# Patient Record
Sex: Female | Born: 1955
Health system: Southern US, Community
[De-identification: ages and names within clinical notes are randomized; demographics above are authoritative.]

## PROBLEM LIST (undated history)

## (undated) DIAGNOSIS — E538 Deficiency of other specified B group vitamins: Secondary | ICD-10-CM

## (undated) DIAGNOSIS — Z8719 Personal history of other diseases of the digestive system: Secondary | ICD-10-CM

## (undated) DIAGNOSIS — F259 Schizoaffective disorder, unspecified: Secondary | ICD-10-CM

## (undated) DIAGNOSIS — F339 Major depressive disorder, recurrent, unspecified: Secondary | ICD-10-CM

## (undated) DIAGNOSIS — D509 Iron deficiency anemia, unspecified: Secondary | ICD-10-CM

## (undated) DIAGNOSIS — F319 Bipolar disorder, unspecified: Secondary | ICD-10-CM

## (undated) DIAGNOSIS — Z86718 Personal history of other venous thrombosis and embolism: Secondary | ICD-10-CM

## (undated) HISTORY — DX: Personal history of other venous thrombosis and embolism: Z86.718

## (undated) HISTORY — DX: Deficiency of other specified B group vitamins: E53.8

## (undated) HISTORY — DX: Major depressive disorder, recurrent, unspecified: F33.9

## (undated) HISTORY — DX: Iron deficiency anemia, unspecified: D50.9

## (undated) HISTORY — DX: Bipolar disorder, unspecified: F31.9

## (undated) HISTORY — DX: Schizoaffective disorder, unspecified: F25.9

## (undated) HISTORY — DX: Personal history of other diseases of the digestive system: Z87.19

---

## 1985-07-12 HISTORY — PX: AUGMENTATION MAMMAPLASTY: SUR837

## 2011-08-31 DIAGNOSIS — F259 Schizoaffective disorder, unspecified: Secondary | ICD-10-CM

## 2011-08-31 HISTORY — DX: Schizoaffective disorder, unspecified: F25.9

## 2011-09-06 DIAGNOSIS — F339 Major depressive disorder, recurrent, unspecified: Secondary | ICD-10-CM

## 2011-09-06 HISTORY — DX: Major depressive disorder, recurrent, unspecified: F33.9

## 2011-12-30 DIAGNOSIS — Z8719 Personal history of other diseases of the digestive system: Secondary | ICD-10-CM

## 2011-12-30 HISTORY — DX: Personal history of other diseases of the digestive system: Z87.19

## 2012-01-25 DIAGNOSIS — E538 Deficiency of other specified B group vitamins: Secondary | ICD-10-CM

## 2012-01-25 HISTORY — DX: Deficiency of other specified B group vitamins: E53.8

## 2013-09-12 DIAGNOSIS — K257 Chronic gastric ulcer without hemorrhage or perforation: Secondary | ICD-10-CM | POA: Insufficient documentation

## 2013-09-12 DIAGNOSIS — K449 Diaphragmatic hernia without obstruction or gangrene: Secondary | ICD-10-CM | POA: Insufficient documentation

## 2014-11-06 DIAGNOSIS — D509 Iron deficiency anemia, unspecified: Secondary | ICD-10-CM

## 2014-11-06 HISTORY — DX: Iron deficiency anemia, unspecified: D50.9

## 2015-01-08 LAB — PROTIME-INR: INR: 2 — AB (ref 0.9–1.1)

## 2015-08-20 LAB — HM MAMMOGRAPHY

## 2015-08-20 LAB — HM DEXA SCAN

## 2017-06-10 LAB — BASIC METABOLIC PANEL
ALBUMIN: 3.6
BUN: 15 (ref 4–21)
CALCIUM: 8.8
CO2: 23
Chloride: 105
Creatinine: 0.6 (ref 0.5–1.1)
EGFR (Non-African Amer.): 97
Glucose: 74
PROTEIN: 6
Potassium: 4.3 (ref 3.4–5.3)
Sodium: 142 (ref 137–147)

## 2017-06-10 LAB — CBC AND DIFFERENTIAL
HCT: 40 (ref 36–46)
Hemoglobin: 13.5 (ref 12.0–16.0)
PLATELETS: 222 (ref 150–399)
WBC: 4.4

## 2017-06-10 LAB — HEPATIC FUNCTION PANEL
ALT: 9 (ref 7–35)
AST: 12 — AB (ref 13–35)

## 2017-06-10 LAB — LIPID PANEL
Cholesterol: 138 (ref 0–200)
HDL: 34 — AB (ref 35–70)
LDL Cholesterol: 87
TRIGLYCERIDES: 84 (ref 40–160)

## 2017-07-27 ENCOUNTER — Ambulatory Visit: Payer: Self-pay | Admitting: Osteopathic Medicine

## 2017-08-02 ENCOUNTER — Ambulatory Visit (INDEPENDENT_AMBULATORY_CARE_PROVIDER_SITE_OTHER): Payer: Medicare Other | Admitting: Osteopathic Medicine

## 2017-08-02 ENCOUNTER — Telehealth: Payer: Self-pay | Admitting: Osteopathic Medicine

## 2017-08-02 ENCOUNTER — Encounter: Payer: Self-pay | Admitting: Osteopathic Medicine

## 2017-08-02 VITALS — BP 140/72 | HR 65 | Temp 98.7°F | Ht 66.0 in | Wt 210.0 lb

## 2017-08-02 DIAGNOSIS — E7800 Pure hypercholesterolemia, unspecified: Secondary | ICD-10-CM

## 2017-08-02 DIAGNOSIS — Z79899 Other long term (current) drug therapy: Secondary | ICD-10-CM

## 2017-08-02 DIAGNOSIS — K5900 Constipation, unspecified: Secondary | ICD-10-CM

## 2017-08-02 DIAGNOSIS — G8929 Other chronic pain: Secondary | ICD-10-CM

## 2017-08-02 DIAGNOSIS — G4700 Insomnia, unspecified: Secondary | ICD-10-CM

## 2017-08-02 DIAGNOSIS — E538 Deficiency of other specified B group vitamins: Secondary | ICD-10-CM | POA: Diagnosis not present

## 2017-08-02 DIAGNOSIS — Z5181 Encounter for therapeutic drug level monitoring: Secondary | ICD-10-CM | POA: Diagnosis not present

## 2017-08-02 DIAGNOSIS — F319 Bipolar disorder, unspecified: Secondary | ICD-10-CM

## 2017-08-02 DIAGNOSIS — G2581 Restless legs syndrome: Secondary | ICD-10-CM

## 2017-08-02 DIAGNOSIS — F3170 Bipolar disorder, currently in remission, most recent episode unspecified: Secondary | ICD-10-CM | POA: Diagnosis not present

## 2017-08-02 DIAGNOSIS — Z7901 Long term (current) use of anticoagulants: Secondary | ICD-10-CM

## 2017-08-02 DIAGNOSIS — F99 Mental disorder, not otherwise specified: Secondary | ICD-10-CM | POA: Diagnosis not present

## 2017-08-02 DIAGNOSIS — D509 Iron deficiency anemia, unspecified: Secondary | ICD-10-CM

## 2017-08-02 DIAGNOSIS — Z86718 Personal history of other venous thrombosis and embolism: Secondary | ICD-10-CM | POA: Diagnosis not present

## 2017-08-02 HISTORY — DX: Bipolar disorder, unspecified: F31.9

## 2017-08-02 HISTORY — DX: Personal history of other venous thrombosis and embolism: Z86.718

## 2017-08-02 MED ORDER — CLONAZEPAM 0.5 MG PO TABS: 0.5000 mg | ORAL_TABLET | Freq: Three times a day (TID) | ORAL | 0 refills | Status: DC | PRN

## 2017-08-02 MED ORDER — FERROUS SULFATE 325 (65 FE) MG PO TABS: 325.0000 mg | ORAL_TABLET | Freq: Every day | ORAL | 1 refills | Status: DC

## 2017-08-02 MED ORDER — HYDROXYZINE HCL 50 MG PO TABS
100.0000 mg | ORAL_TABLET | Freq: Every evening | ORAL | 1 refills | Status: DC | PRN
Start: 2017-08-02 — End: 2017-10-03

## 2017-08-02 MED ORDER — METOPROLOL SUCCINATE ER 100 MG PO TB24: 100.0000 mg | ORAL_TABLET | Freq: Every day | ORAL | 1 refills | Status: DC

## 2017-08-02 MED ORDER — FENOFIBRATE 145 MG PO TABS: 145.0000 mg | ORAL_TABLET | Freq: Every day | ORAL | 1 refills | Status: DC

## 2017-08-02 MED ORDER — DOCUSATE SODIUM 100 MG PO CAPS: 100.0000 mg | ORAL_CAPSULE | Freq: Every day | ORAL | 1 refills | Status: DC

## 2017-08-02 MED ORDER — VITAMIN B-12 1000 MCG PO TABS: ORAL_TABLET | ORAL | 1 refills | Status: DC

## 2017-08-02 MED ORDER — ROPINIROLE HCL 1 MG PO TABS: 1.0000 mg | ORAL_TABLET | Freq: Two times a day (BID) | ORAL | 1 refills | Status: DC

## 2017-08-02 MED ORDER — WARFARIN SODIUM 5 MG PO TABS: ORAL_TABLET | ORAL | 1 refills | Status: DC

## 2017-08-02 NOTE — Patient Instructions (Signed)
   Will get records from Dr Teola BradleyBarr and Dr Caryn SectionFox  I am not comfortable continuing Clonazepam for patients who are on strong pain medications. I will discuss with Dr Caryn SectionFox and will refill for now to prevent withdrawals, but further refills will very likely be a his discretion. Will know more after I speak with him about your case.   Will send refills by end of day today, should be ready at your pharmacy tomorrow.   Plan to recheck with me in 1-2 months, I should have received all necessary records by that time. Come see me sooner if needed!

## 2017-08-02 NOTE — Telephone Encounter (Signed)
Please call patient or can speak to Honor JunesAngela White, caseworker.   I reviewed the records from Dr. Caryn SectionFox, psychiatrist. He had some concerns about continuing with the clonazepam/Klonopin given the other medications she is taking. I'll still refill for now until I have reviewed the records from Dr. Teola BradleyBarr but at next visit, we will have a discussion about tapering down and coming off of this medication (clonazepam) or she can discuss it with Dr. Caryn SectionFox. I had to fax this prescription into the pharmacy, was unable to send it electronically.   Other medications should be no issues, everything was sent to pharmacy on file[facility] [place].

## 2017-08-02 NOTE — Progress Notes (Signed)
HPI: Darden DatesMargaret B Howell is a 62 y.o. female who  has no past medical history on file.  she presents to [facility] today, 08/02/17,  for chief complaint of:   New to establish care. See specific headings.   New patient presents to clinic with her case worker, Honor Junesngela White. I have patient's permission to speak in front of this person freely.  Previously was being followed by a physician service which came to her home, she states that they wanted to see her every month but this was going to be too expensive for her. Her caseworker has a bit of a different story, so like there may have been some missed appointments. We'll need to await records there.  Also awaiting records from previous PCP with regards to more extensive medical history. It appears she is being followed by psychiatry, I receive these notes before the end of the day, see below. Looks like she is also under treatment from previous PCP with a few other psychoactive/CNS medications, including clonazepam 0.5 mg 2 tablets a.m. and 1 tablet at bedtime, ropinirole 1 mg twice daily, hydroxyzine at bedtime as needed for sleep. Other medications include vitamin B12, ferrous sulfate, stool softener, metoprolol, senna separate, warfarin.   Patient is checking INR at home on a weekly basis. She is currently taking 5 mg warfarin, 1.5 tablets on Tuesdays and Fridays, 1 tablet all other days. It confusing story here patient states that she had a blood clot due to anemia requiring a blood transfusion at one point, she has been very nervous to come off of anticoagulation given her family history, but no personal history, of stroke and heart issues. Patient also has a documented history of a GI bleed in her problem list.   History of schizoaffective disorder, bipolar type. History of alcohol dependence and cocaine dependence, both in remission. Dr Caryn SectionFox - psychiatry. He is not the prescriber for the benzodiazepine. Per his  most recent no 07/20/2017: "Remain concerned about her opiate and benzo, prescribed elsewhere but patient dismisses dangers." He is continuing the mirtazapine, risperidone, melatonin without change and planning to follow-up every 4 months or sooner as needed.   Chronic pain:  PMP reviewed. She is also on Fentanyl patches is not on her medication list. Caseworker does have records from pain management, patient was recently seen there and there were no concerns.   Patient is accompanied by social worker/guardian who assists with history-taking.   Past medical, surgical, social and family history reviewed:  Patient Active Problem List   Diagnosis Date Noted  . Bipolar disorder (HCC) 08/02/2017  . High risk medication use 08/02/2017  . History of DVT (deep vein thrombosis) 08/02/2017  . Other chronic pain 08/02/2017  . Psychiatric illness 08/02/2017  . Iron deficiency anemia 11/06/2014  . Chronic gastric ulcer without hemorrhage or perforation 09/12/2013  . Paraesophageal hernia 09/12/2013  . B12 deficiency 01/25/2012  . History of GI bleed 12/30/2011  . Major depressive disorder, recurrent episode (HCC) 09/06/2011  . Schizoaffective disorder (HCC) 08/31/2011     Social History   Tobacco Use  . Smoking status: Not on file  Substance Use Topics  . Alcohol use: Not on file    No family history on file.   Current medication list and allergy/intolerance information reviewed:    Current Outpatient Medications  Medication Sig Dispense Refill  . clonazePAM (KLONOPIN) 0.5 MG tablet Take 0.5 mg by mouth 3 (three) times daily as needed (Taking 2 in the  morning and 1 at bedtime).     Marland Kitchen docusate sodium (COLACE) 100 MG capsule Take 100-200 mg by mouth daily.     . fenofibrate (TRICOR) 145 MG tablet     . fentaNYL (DURAGESIC - DOSED MCG/HR) 75 MCG/HR Place onto the skin.    . ferrous sulfate 325 (65 FE) MG tablet Take by mouth.    . hydrOXYzine (ATARAX/VISTARIL) 50 MG tablet Take 100 mg  by mouth at bedtime as needed (help with sleep).    . Melatonin 3 MG TABS Take 3 mg by mouth at bedtime.    . metoprolol succinate (TOPROL-XL) 100 MG 24 hr tablet     . mirtazapine (REMERON) 45 MG tablet Take 45 mg by mouth at bedtime.     . risperiDONE (RISPERDAL) 3 MG tablet Take 3 mg by mouth at bedtime.     Marland Kitchen rOPINIRole (REQUIP) 1 MG tablet Take 1 mg by mouth 2 (two) times daily.    . vitamin B-12 (CYANOCOBALAMIN) 1000 MCG tablet TAKE 1 TABLET BY MOUTH DAILY FOR 30 DAYS.    Marland Kitchen warfarin (COUMADIN) 5 MG tablet Take 5 mg by mouth. 1.5 tablets Tuesdays, Fridays. 1 tablet all other days    . albuterol (PROVENTIL HFA;VENTOLIN HFA) 108 (90 Base) MCG/ACT inhaler Inhale into the lungs.    Marland Kitchen amitriptyline (ELAVIL) 100 MG tablet Take by mouth.    . levothyroxine (SYNTHROID, LEVOTHROID) 25 MCG tablet Take by mouth.    . nystatin cream (MYCOSTATIN) Apply to affected areas under breasts in in groin line 3-4 times per day until rash cleared.    . sertraline (ZOLOFT) 100 MG tablet Take by mouth.    . triamcinolone cream (KENALOG) 0.1 % APPLY TO THE AFFECTED AREA(S) UNDER breast AND BETWEEN LEGS 3 TO 4 times a day until clear     No current facility-administered medications for this visit.     Allergies not on file    Review of Systems:  Constitutional:  No  fever, no chills, No recent illness, No unintentional weight changes. +significant fatigue.   HEENT: No  headache, no vision change, no hearing change, No sore throat, No  sinus pressure  Cardiac: No  chest pain, No  pressure, No palpitations, No  Orthopnea  Respiratory:  No  shortness of breath. No  Cough  Gastrointestinal: No  abdominal pain, No  nausea, No  vomiting,  No  blood in stool, No  diarrhea, No  constipation   Musculoskeletal: No new myalgia/arthralgia  Genitourinary: No  incontinence, No  abnormal genital bleeding, No abnormal genital discharge  Skin: No  Rash, No other wounds/concerning lesions  Hem/Onc: No  easy  bruising/bleeding, No  abnormal lymph node  Endocrine: No cold intolerance,  No heat intolerance. No polyuria/polydipsia/polyphagia   Neurologic: No  weakness, No  dizziness, No  slurred speech/focal weakness/facial droop  Psychiatric: +concerns with depression, +concerns with anxiety, +sleep problems, No mood problems  Exam:  BP 140/72   Pulse 65   Temp 98.7 F (37.1 C) (Oral)   Ht 5\' 6"  (1.676 m)   Wt 210 lb 0.6 oz (95.3 kg)   BMI 33.90 kg/m   Constitutional: VS see above. General Appearance: alert, well-developed, well-nourished, NAD  Eyes: Normal lids and conjunctive, non-icteric sclera  Ears, Nose, Mouth, Throat: MMM, Normal external inspection ears/nares/mouth/lips/gums. TM normal bilaterally. Pharynx/tonsils no erythema, no exudate. Nasal mucosa normal.   Neck: No masses, trachea midline. No thyroid enlargement. No tenderness/mass appreciated. No lymphadenopathy  Respiratory: Normal respiratory effort.  no wheeze, no rhonchi, no rales  Cardiovascular: S1/S2 normal, no murmur, no rub/gallop auscultated. RRR. No lower extremity edema.   Musculoskeletal: Gait normal.  Neurological: Normal balance/coordination. No tremor.  Skin: warm, dry, intact. No rash/ulcer.    Psychiatric: Poor judgment/insight. Flat mood and affect. Oriented x3.     ASSESSMENT/PLAN: See instructions below. Obviously, I have reservations about some of the medications she is taking. Will withhold judgment and refill for now until I have had a chance to review psychiatric records, records from previous PCP. We will certainly have a discussion about tapering down and coming off of the clonazepam versus having psychiatrist take this over. Based on his record review though, looks like he is hesitant to do this also given the obvious risks. Would also strongly consider discontinuing anticoagulation, patient seems to be using this as a security blanket due to her concern for stroke. I'm not sure that the  benefits outweigh the risks in this case.  High risk medication use - Plan: clonazePAM (KLONOPIN) 0.5 MG tablet  Anticoagulation goal of INR 2 to 3  History of DVT (deep vein thrombosis) - Plan: warfarin (COUMADIN) 5 MG tablet  Psychiatric illness - Plan: clonazePAM (KLONOPIN) 0.5 MG tablet  Other chronic pain  Bipolar affective disorder in remission (HCC)  Restless leg syndrome - Plan: rOPINIRole (REQUIP) 1 MG tablet  B12 deficiency - Plan: vitamin B-12 (CYANOCOBALAMIN) 1000 MCG tablet  Iron deficiency anemia, unspecified iron deficiency anemia type - Plan: ferrous sulfate 325 (65 FE) MG tablet  Constipation, unspecified constipation type - Plan: docusate sodium (COLACE) 100 MG capsule  High cholesterol - Plan: fenofibrate (TRICOR) 145 MG tablet  Insomnia, unspecified type - Plan: hydrOXYzine (ATARAX/VISTARIL) 50 MG tablet    Patient Instructions   Will get records from Dr Teola Bradley and Dr Caryn Section  I am not comfortable continuing Clonazepam for patients who are on strong pain medications. I will discuss with Dr Caryn Section and will refill for now to prevent withdrawals, but further refills will very likely be a his discretion. Will know more after I speak with him about your case.   Will send refills by end of day today, should be ready at your pharmacy tomorrow.   Plan to recheck with me in 1-2 months, I should have received all necessary records by that time. Come see me sooner if needed!     Visit summary with medication list and pertinent instructions was printed for patient to review. All questions at time of visit were answered - patient instructed to contact office with any additional concerns. ER/RTC precautions were reviewed with the patient.   Follow-up plan: Return for recheck chronic medical problems in 1-2 months, sooner if needed .  Note: Total time spent 60 minutes, greater than 50% of the visit was spent face-to-face counseling and coordinating care for the following: The  primary encounter diagnosis was High risk medication use. Diagnoses of Anticoagulation goal of INR 2 to 3, History of DVT (deep vein thrombosis), Psychiatric illness, Other chronic pain, Bipolar affective disorder in remission (HCC), Restless leg syndrome, B12 deficiency, Iron deficiency anemia, unspecified iron deficiency anemia type, Constipation, unspecified constipation type, High cholesterol, and Insomnia, unspecified type were also pertinent to this visit.Marland Kitchen  Please note: voice recognition software was used to produce this document, and typos may escape review. Please contact Dr. Lyn Hollingshead for any needed clarifications.

## 2017-08-03 ENCOUNTER — Telehealth: Payer: Self-pay

## 2017-08-03 ENCOUNTER — Other Ambulatory Visit: Payer: Self-pay | Admitting: Osteopathic Medicine

## 2017-08-03 DIAGNOSIS — Z79899 Other long term (current) drug therapy: Secondary | ICD-10-CM

## 2017-08-03 DIAGNOSIS — F99 Mental disorder, not otherwise specified: Secondary | ICD-10-CM

## 2017-08-03 NOTE — Telephone Encounter (Signed)
Noted, I already reviewed Dr Scherrie GerlachFox's records but I will not call him. For the record, based on review of his notes, he and I are of the same opinion regarding her Klonopin.

## 2017-08-03 NOTE — Telephone Encounter (Signed)
Pt called stating she is not comfortable with you having a review w/ Dr. Caryn SectionFox regarding her klonopin medication. As per pt, she was never comfortable with Dr. Caryn SectionFox and feels doctor will not be open with pcp. Pt is requesting to keep taking klonopin for severe anxiety. Requesting feedback from pcp. Pls advise, thanks.

## 2017-08-03 NOTE — Telephone Encounter (Signed)
Called patient - spoke to Sunocongela White, caseworker and advised of provider/s notations and recommendations. Marylene Landngela stated she understood and had no other questions or concerns.

## 2017-08-04 NOTE — Telephone Encounter (Signed)
This should have been faxed yesterday (e-rx wasn't working for me) can we call them to confirm?

## 2017-08-04 NOTE — Telephone Encounter (Signed)
Yolanda ChildesFYI - [facility] requesting medication [facility].

## 2017-08-04 NOTE — Telephone Encounter (Signed)
Contacted [facility] - spoke to [place], confirmed fax request for clonopin rx rec'd yesterday. Disregard electronic request for renewal.

## 2017-08-10 ENCOUNTER — Encounter: Payer: Self-pay | Admitting: Osteopathic Medicine

## 2017-08-29 ENCOUNTER — Other Ambulatory Visit: Payer: Self-pay | Admitting: Osteopathic Medicine

## 2017-08-29 DIAGNOSIS — F99 Mental disorder, not otherwise specified: Secondary | ICD-10-CM

## 2017-08-29 DIAGNOSIS — Z79899 Other long term (current) drug therapy: Secondary | ICD-10-CM

## 2017-08-29 NOTE — Telephone Encounter (Signed)
[facility] medication refill for clonazepam. Thanks.

## 2017-08-30 NOTE — Telephone Encounter (Signed)
Pt advised.

## 2017-08-30 NOTE — Telephone Encounter (Signed)
Refill sent Needs appt for further refills Will discuss reducing dose at next visit

## 2017-09-02 LAB — POCT INR: INR: 2 — AB (ref 0.9–1.1)

## 2017-09-20 ENCOUNTER — Ambulatory Visit: Payer: Medicare Other | Admitting: Osteopathic Medicine

## 2017-09-27 ENCOUNTER — Encounter: Payer: Self-pay | Admitting: Osteopathic Medicine

## 2017-09-27 ENCOUNTER — Ambulatory Visit (INDEPENDENT_AMBULATORY_CARE_PROVIDER_SITE_OTHER): Payer: Medicare Other | Admitting: Osteopathic Medicine

## 2017-09-27 VITALS — BP 140/79 | HR 63 | Temp 98.7°F | Wt 213.0 lb

## 2017-09-27 DIAGNOSIS — G2581 Restless legs syndrome: Secondary | ICD-10-CM

## 2017-09-27 DIAGNOSIS — Z5181 Encounter for therapeutic drug level monitoring: Secondary | ICD-10-CM

## 2017-09-27 DIAGNOSIS — G4700 Insomnia, unspecified: Secondary | ICD-10-CM | POA: Diagnosis not present

## 2017-09-27 DIAGNOSIS — F411 Generalized anxiety disorder: Secondary | ICD-10-CM | POA: Diagnosis not present

## 2017-09-27 DIAGNOSIS — Z86718 Personal history of other venous thrombosis and embolism: Secondary | ICD-10-CM

## 2017-09-27 DIAGNOSIS — F99 Mental disorder, not otherwise specified: Secondary | ICD-10-CM | POA: Diagnosis not present

## 2017-09-27 DIAGNOSIS — F3170 Bipolar disorder, currently in remission, most recent episode unspecified: Secondary | ICD-10-CM | POA: Diagnosis not present

## 2017-09-27 DIAGNOSIS — Z7901 Long term (current) use of anticoagulants: Secondary | ICD-10-CM

## 2017-09-27 DIAGNOSIS — Z79899 Other long term (current) drug therapy: Secondary | ICD-10-CM

## 2017-09-27 LAB — POCT INR: INR: 2.6

## 2017-09-27 MED ORDER — CLONAZEPAM 0.5 MG PO TABS: 0.2500 mg | ORAL_TABLET | Freq: Three times a day (TID) | ORAL | 0 refills | Status: DC | PRN

## 2017-09-27 NOTE — Progress Notes (Signed)
HPI: Yolanda Howell is a 62 y.o. female who  has a past medical history of B12 deficiency (01/25/2012), Bipolar disorder (HCC) (08/02/2017), History of DVT (deep vein thrombosis) (08/02/2017), History of GI bleed (12/30/2011), Iron deficiency anemia (11/06/2014), Major depressive disorder, recurrent episode (HCC) (09/06/2011), and Schizoaffective disorder (HCC) (08/31/2011).  [facility] today, 09/27/17,  for chief complaint of:   Follow up - see specific headings   Here with her case worker, Honor JunesAngela White. I have patient's permission to speak in front of this person freely.  Previously was being followed by a physician service which came to her home, no records available  We continued treatment from previous PCP w/ some modifications - clonazepam 0.5 mg 2 tablets a.m. and 1 tablet at bedtime decreased to one half tablet, ropinirole 1 mg twice daily, hydroxyzine at bedtime as needed for sleep.   Patient is checking INR at home on a weekly basis. She is currently taking 5 mg warfarin, 1.5 tablets on Tuesdays and Fridays, 1 tablet all other days. She states that she had a blood clot due to anemia requiring a blood transfusion at one point, she has been very nervous to come off of anticoagulation given her family history, but no personal history, of stroke and heart issues. Patient also has a documented history of a GI bleed in her problem list. We discussed at last visit but I do not think anticoagulation is necessarily warranted in her case but given her significant anxiety coming off of this medication we were okay to continue it  History of schizoaffective disorder, bipolar type. History of alcohol dependence and cocaine dependence, both in remission. Dr Caryn SectionFox - psychiatry. He is not the prescriber for the benzodiazepine. Per his most recent no 07/20/2017: "Remain concerned about her opiate and benzo, prescribed elsewhere but patient dismisses dangers." He  is continuing the mirtazapine, risperidone, melatonin without change and planning to follow-up every 4 months or sooner as needed.   Patient is accompanied by social worker/guardian who assists with history-taking.   Past medical, surgical, social and family history reviewed:  Patient Active Problem List   Diagnosis Date Noted  . Bipolar disorder (HCC) 08/02/2017  . High risk medication use 08/02/2017  . History of DVT (deep vein thrombosis) 08/02/2017  . Other chronic pain 08/02/2017  . Psychiatric illness 08/02/2017  . Iron deficiency anemia 11/06/2014  . Chronic gastric ulcer without hemorrhage or perforation 09/12/2013  . Paraesophageal hernia 09/12/2013  . B12 deficiency 01/25/2012  . History of GI bleed 12/30/2011  . Major depressive disorder, recurrent episode (HCC) 09/06/2011  . Schizoaffective disorder (HCC) 08/31/2011     Social History   Tobacco Use  . Smoking status: Current Every Day Smoker  . Smokeless tobacco: Never Used  Substance Use Topics  . Alcohol use: No    Frequency: Never    No family history on file.   Current medication list and allergy/intolerance information reviewed:    Current Outpatient Medications  Medication Sig Dispense Refill  . clonazePAM (KLONOPIN) 0.5 MG tablet Take 0.5 tablets (0.25 mg total) by mouth 3 (three) times daily as needed for anxiety. Discuss reduce dose next visit. Need appt for refills 90 tablet 0  . docusate sodium (COLACE) 100 MG capsule Take 1-2 capsules (100-200 mg total) by mouth daily. 60 capsule 1  . fenofibrate (TRICOR) 145 MG tablet Take 1 tablet (145 mg total) by mouth daily. 30 tablet 1  . fentaNYL (DURAGESIC - DOSED  MCG/HR) 75 MCG/HR Place onto the skin.    . ferrous sulfate 325 (65 FE) MG tablet Take 1 tablet (325 mg total) by mouth daily with breakfast. 30 tablet 1  . hydrOXYzine (ATARAX/VISTARIL) 50 MG tablet Take 2 tablets (100 mg total) by mouth at bedtime as needed (help with sleep). 60 tablet 1  .  Melatonin 3 MG TABS Take 3 mg by mouth at bedtime.    . metoprolol succinate (TOPROL-XL) 100 MG 24 hr tablet Take 1 tablet (100 mg total) by mouth daily. 30 tablet 1  . mirtazapine (REMERON) 45 MG tablet Take 45 mg by mouth at bedtime.     . risperiDONE (RISPERDAL) 3 MG tablet Take 3 mg by mouth at bedtime.     Marland Kitchen rOPINIRole (REQUIP) 1 MG tablet Take 1 tablet (1 mg total) by mouth 2 (two) times daily. 60 tablet 1  . vitamin B-12 (CYANOCOBALAMIN) 1000 MCG tablet TAKE 1 TABLET BY MOUTH DAILY 30 tablet 1  . warfarin (COUMADIN) 5 MG tablet 1.5 tablets Tuesdays, Fridays. 1 tablet all other days 60 tablet 1   No current facility-administered medications for this visit.     Allergies  Allergen Reactions  . Azithromycin Hives      Review of Systems:  Constitutional:  No  fever, no chills, No recent illness,  HEENT: No  headache  Cardiac: No  chest pain  Respiratory:  No  shortness of breath. No  Cough  Gastrointestinal: No  abdominal pain  Musculoskeletal: No new myalgia/arthralgia  Skin: No  Rash  Neurologic: No  weakness, No  dizziness,  Psychiatric: +concerns with depression, +concerns with anxiety, +sleep problems, No mood problems  Exam:  BP 140/79 (BP Location: Left Arm)   Pulse 63   Temp 98.7 F (37.1 C) (Oral)   Wt 213 lb (96.6 kg)   BMI 34.38 kg/m   Constitutional: VS see above. General Appearance: alert, well-developed, well-nourished, NAD  Eyes: Normal lids and conjunctive, non-icteric sclera  Ears, Nose, Mouth, Throat: MMM, Normal external inspection ears/nares/mouth/lips/gums.   Neck: No masses, trachea midline. No thyroid enlargement. No tenderness/mass appreciated. No lymphadenopathy  Respiratory: Normal respiratory effort. no wheeze, no rhonchi, no rales  Cardiovascular: S1/S2 normal, no murmur, no rub/gallop auscultated. RRR. No lower extremity edema.   Musculoskeletal: Gait normal.  Neurological: Normal balance/coordination. No tremor.  Skin:  warm, dry, intact. No rash/ulcer.    Psychiatric: Poor judgment/insight. Flat mood and affect. Oriented x3.     ASSESSMENT/PLAN:  I have reservations about some of the medications she is taking, with tapering down a bit on the clonazepam, may need discussion with psychiatry regarding control of anxiety/bipolar with other more appropriate medications. it sounds like psychiatry and I on the same page as far as this is concerned. The patient know that within a year will try to have her off the benzodiazepines.  Would also strongly consider discontinuing anticoagulation, patient seems to be using this as a security blanket due to her concern for stroke.   Anticoagulation goal of INR 2 to 3 - Plan: POCT INR  High risk medication use - Plan: clonazePAM (KLONOPIN) 0.5 MG tablet  Psychiatric illness - Plan: clonazePAM (KLONOPIN) 0.5 MG tablet  History of DVT (deep vein thrombosis)  Bipolar affective disorder in remission (HCC)  Insomnia, unspecified type  Restless leg syndrome  Generalized anxiety disorder    Patient Instructions  Plan:  Next time you call in your INR, make sure to tell them our office information: our phone number  is [phone], they can also fax results to [place] at [phone]. INR looks fine today, can recheck in 4 weeks   Will try tapering down on the Klonopin. 1/2 tablet three times a day for now, but try taking it twice a day a few times and see how you do. Our goal is to be off this medication in the next year!       Visit summary with medication list and pertinent instructions was printed for patient to review. All questions at time of visit were answered - patient instructed to contact office with any additional concerns. ER/RTC precautions were reviewed with the patient.   Follow-up plan: Return in about 6 months (around 03/30/2018) for ANNUAL WELLNESS CHECK-UP / PREVENTIVE CARE, SOONER IF NEEDED .  Note: Total time spent , greater than 50% of  the visit was spent face-to-face counseling and coordinating care for the following: The primary encounter diagnosis was Anticoagulation goal of INR 2 to 3. Diagnoses of High risk medication use, Psychiatric illness, History of DVT (deep vein thrombosis), Bipolar affective disorder in remission (HCC), Insomnia, unspecified type, Restless leg syndrome, and Generalized anxiety disorder were also pertinent to this visit.Marland Kitchen  Please note: voice recognition software was used to produce this document, and typos may escape review. Please contact Dr. Lyn Hollingshead for any needed clarifications.

## 2017-09-27 NOTE — Patient Instructions (Addendum)
Plan:  Next time you call in your INR, make sure to tell them our office information: our phone number is [phone], they can also fax results to us at [phone]. INR looks fine today, can recheck in 4 weeks   Will try tapering down on the Klonopin. 1/2 tablet three times a day for now, but try taking it twice a day a few times and see how you do. Our goal is to be off this medication in the next year!

## 2017-09-29 ENCOUNTER — Encounter: Payer: Self-pay | Admitting: Osteopathic Medicine

## 2017-10-03 ENCOUNTER — Other Ambulatory Visit: Payer: Self-pay | Admitting: Osteopathic Medicine

## 2017-10-03 DIAGNOSIS — G4700 Insomnia, unspecified: Secondary | ICD-10-CM

## 2017-10-03 DIAGNOSIS — G2581 Restless legs syndrome: Secondary | ICD-10-CM

## 2017-10-04 ENCOUNTER — Ambulatory Visit: Payer: Medicare Other | Admitting: Osteopathic Medicine

## 2017-10-06 LAB — POCT INR: INR: 2.2 — AB (ref 0.9–1.1)

## 2017-10-30 ENCOUNTER — Encounter: Payer: Self-pay | Admitting: Osteopathic Medicine

## 2017-10-30 ENCOUNTER — Other Ambulatory Visit: Payer: Self-pay | Admitting: Osteopathic Medicine

## 2017-10-30 DIAGNOSIS — Z86718 Personal history of other venous thrombosis and embolism: Secondary | ICD-10-CM

## 2017-10-30 LAB — PROTIME-INR: INR: 2.1 — AB (ref 0.9–1.1)

## 2017-10-31 ENCOUNTER — Telehealth: Payer: Self-pay

## 2017-10-31 NOTE — Telephone Encounter (Signed)
Darl PikesSusan from [facility] called - still awaiting on completion of paperwork. Faxed to office several days ago. Regarding pt's at home INR testing. Requesting feedback from provider. She can be contacted at [phone], M-F 8 AM - 5 PM.

## 2017-10-31 NOTE — Telephone Encounter (Signed)
I definitely filled out the papers and we sent these. If she needs to send me another copy I'll be happy to complete these again.

## 2017-11-01 NOTE — Telephone Encounter (Signed)
Spoke with Jonny RuizJohn Nurse, children's(receptionist) at [facility]. Darl PikesSusan was unavailable at time of call. Informed that paperwork was re-fax. Confirmation was rec'd. Call back information provided.

## 2017-11-07 LAB — POCT INR: INR: 2.1 — AB (ref 0.9–1.1)

## 2017-11-08 ENCOUNTER — Telehealth: Payer: Self-pay | Admitting: Osteopathic Medicine

## 2017-11-08 NOTE — Telephone Encounter (Signed)
Please call patient: I received and reviewed her INR numbers.  Everything normal range.  Continue current medication dose  See scanned docs for results

## 2017-11-08 NOTE — Telephone Encounter (Signed)
Spoke with Marylene Land, pt's legal guardian with [facility], and advised of results.   No further questions, concerns, or needs at this time.

## 2017-11-11 ENCOUNTER — Encounter: Payer: Self-pay | Admitting: Osteopathic Medicine

## 2017-11-14 LAB — POCT INR: INR: 2.1 — AB (ref 0.9–1.1)

## 2017-11-19 LAB — POCT INR: INR: 1.9 — AB (ref 0.9–1.1)

## 2017-11-21 LAB — POCT INR: INR: 1.9 — AB (ref 0.9–1.1)

## 2017-11-22 ENCOUNTER — Telehealth: Payer: Self-pay | Admitting: Osteopathic Medicine

## 2017-11-22 NOTE — Telephone Encounter (Signed)
Case worker Marylene Land has been updated.

## 2017-11-22 NOTE — Telephone Encounter (Signed)
Please call patient: INR was 1.9, only slightly below range, I think it's fine to recheck again in one week since most of her other levels have been normal and this is only slightly off.

## 2017-11-29 ENCOUNTER — Encounter: Payer: Self-pay | Admitting: Osteopathic Medicine

## 2017-11-29 LAB — PROTIME-INR

## 2017-11-30 ENCOUNTER — Other Ambulatory Visit: Payer: Self-pay | Admitting: Osteopathic Medicine

## 2017-11-30 ENCOUNTER — Encounter: Payer: Self-pay | Admitting: Osteopathic Medicine

## 2017-11-30 DIAGNOSIS — G4700 Insomnia, unspecified: Secondary | ICD-10-CM

## 2017-11-30 DIAGNOSIS — G2581 Restless legs syndrome: Secondary | ICD-10-CM

## 2017-11-30 DIAGNOSIS — E7800 Pure hypercholesterolemia, unspecified: Secondary | ICD-10-CM

## 2017-12-02 ENCOUNTER — Other Ambulatory Visit: Payer: Self-pay | Admitting: Osteopathic Medicine

## 2017-12-02 ENCOUNTER — Telehealth: Payer: Self-pay | Admitting: Osteopathic Medicine

## 2017-12-02 DIAGNOSIS — K5900 Constipation, unspecified: Secondary | ICD-10-CM

## 2017-12-02 DIAGNOSIS — F99 Mental disorder, not otherwise specified: Secondary | ICD-10-CM

## 2017-12-02 DIAGNOSIS — D509 Iron deficiency anemia, unspecified: Secondary | ICD-10-CM

## 2017-12-02 DIAGNOSIS — Z79899 Other long term (current) drug therapy: Secondary | ICD-10-CM

## 2017-12-02 NOTE — Telephone Encounter (Signed)
INR low again, slightly.   Confirm current dose is 1.5 tablet on Tues, Fri and 1 tablet all other days?   If that's correct, ask her to increase to 1.5 tablet on Tues, Fri, and Sunday and 1 tablet all other days and recheck INR one week  If not correct, please confirm true current dose and call me!

## 2017-12-02 NOTE — Telephone Encounter (Signed)
Left a detailed vm msg for Case worker Marylene Land re: provider's note. Call back information provided.

## 2017-12-02 NOTE — Telephone Encounter (Signed)
[facility] requesting med RF for clonazepam.

## 2017-12-06 ENCOUNTER — Telehealth: Payer: Self-pay

## 2017-12-06 LAB — POCT INR: INR: 2.1 — AB (ref 0.9–1.1)

## 2017-12-06 NOTE — Telephone Encounter (Signed)
Left vm msg for pt re: med RF sent to local pharmacy. Call back information provider.

## 2017-12-06 NOTE — Telephone Encounter (Signed)
Already sent.

## 2017-12-06 NOTE — Telephone Encounter (Signed)
[facility] requesting med RF for clonzepam.

## 2017-12-06 NOTE — Telephone Encounter (Signed)
Noted  

## 2017-12-07 ENCOUNTER — Telehealth: Payer: Self-pay | Admitting: Osteopathic Medicine

## 2017-12-07 ENCOUNTER — Encounter: Payer: Self-pay | Admitting: Osteopathic Medicine

## 2017-12-07 NOTE — Telephone Encounter (Signed)
Called and spoke with Legal Guardian, Marylene Land. She was not in a place to speak so she will return clinic call tomorrow morning.

## 2017-12-07 NOTE — Telephone Encounter (Signed)
Please call patient: INR in goal range, continue current medication dosing - I don't know that we ever heard back (see phone note 12/02/17) to confirm dosing - if we can just confirm what she is taking to make sure our records are accurate? Thanks!

## 2017-12-08 ENCOUNTER — Other Ambulatory Visit: Payer: Self-pay | Admitting: *Deleted

## 2017-12-08 DIAGNOSIS — Z86718 Personal history of other venous thrombosis and embolism: Secondary | ICD-10-CM

## 2017-12-08 MED ORDER — WARFARIN SODIUM 5 MG PO TABS: ORAL_TABLET | ORAL | 1 refills | Status: DC

## 2017-12-08 NOTE — Telephone Encounter (Signed)
Spoke with legal guardian. Verified Coumadin dose of 1 1/2 tabs on T,F and Sunday and 1 tab all other days. Also sent new refill to pt pharmacy. KG LPN

## 2017-12-08 NOTE — Progress Notes (Signed)
Spoke with care taker and confirmed pt Coumadin dose of take 1 1/2 tabs on Tuesday, Friday and Sunday. Take one tab all other days. Sent new refill to pharmacy. KG LPN

## 2017-12-14 ENCOUNTER — Encounter: Payer: Self-pay | Admitting: Osteopathic Medicine

## 2017-12-14 LAB — PROTIME-INR: INR: 2.1 — AB (ref 0.9–1.1)

## 2017-12-21 ENCOUNTER — Encounter: Payer: Self-pay | Admitting: Osteopathic Medicine

## 2017-12-21 LAB — PROTIME-INR: INR: 2 — AB (ref 0.9–1.1)

## 2017-12-29 LAB — POCT INR: INR: 2.1 — AB (ref 0.9–1.1)

## 2018-01-02 ENCOUNTER — Other Ambulatory Visit: Payer: Self-pay

## 2018-01-02 DIAGNOSIS — Z79899 Other long term (current) drug therapy: Secondary | ICD-10-CM

## 2018-01-02 DIAGNOSIS — F99 Mental disorder, not otherwise specified: Secondary | ICD-10-CM

## 2018-01-02 NOTE — Telephone Encounter (Signed)
[facility] for Clonazepam. Last refilled 12-06-17  RX pended, please review and send if appropriate.  Thanks!

## 2018-01-03 MED ORDER — CLONAZEPAM 0.5 MG PO TABS: ORAL_TABLET | ORAL | 0 refills | Status: DC

## 2018-01-03 NOTE — Telephone Encounter (Signed)
Left msg with Guardian that RX sent to pharmacy

## 2018-01-04 ENCOUNTER — Encounter: Payer: Self-pay | Admitting: Osteopathic Medicine

## 2018-01-04 LAB — INR (THROMBOREL-S)
INR: 2
INR: 2.1

## 2018-01-11 LAB — POCT INR: INR: 2 — AB (ref 0.9–1.1)

## 2018-01-17 ENCOUNTER — Encounter: Payer: Self-pay | Admitting: Osteopathic Medicine

## 2018-01-17 LAB — POCT INR: INR: 2.1 — AB (ref 0.9–1.1)

## 2018-01-23 ENCOUNTER — Other Ambulatory Visit: Payer: Self-pay | Admitting: Osteopathic Medicine

## 2018-01-23 DIAGNOSIS — Z79899 Other long term (current) drug therapy: Secondary | ICD-10-CM

## 2018-01-23 DIAGNOSIS — K5900 Constipation, unspecified: Secondary | ICD-10-CM

## 2018-01-23 DIAGNOSIS — D509 Iron deficiency anemia, unspecified: Secondary | ICD-10-CM

## 2018-01-23 DIAGNOSIS — F99 Mental disorder, not otherwise specified: Secondary | ICD-10-CM

## 2018-01-25 ENCOUNTER — Encounter: Payer: Self-pay | Admitting: Osteopathic Medicine

## 2018-01-25 LAB — POCT INR
INR: 2.1 — AB (ref 0.9–1.1)
INR: 2.1 (ref 2.0–3.0)

## 2018-01-27 ENCOUNTER — Other Ambulatory Visit: Payer: Self-pay | Admitting: Osteopathic Medicine

## 2018-01-27 DIAGNOSIS — F99 Mental disorder, not otherwise specified: Secondary | ICD-10-CM

## 2018-01-27 DIAGNOSIS — Z79899 Other long term (current) drug therapy: Secondary | ICD-10-CM

## 2018-01-30 LAB — POCT INR: INR: 2.1 — AB (ref 0.9–1.1)

## 2018-01-30 NOTE — Telephone Encounter (Signed)
Left a detailed msg on Yolanda Howell's vm (pt's guardian) regarding med RF. Call back information provided.

## 2018-01-30 NOTE — Telephone Encounter (Signed)
[facility] requesting med RF for clonazepam.

## 2018-01-31 ENCOUNTER — Encounter: Payer: Self-pay | Admitting: Osteopathic Medicine

## 2018-02-07 LAB — POCT INR: INR: 2.1 — AB (ref 0.9–1.1)

## 2018-02-13 LAB — POCT INR: INR: 2 — AB (ref 0.9–1.1)

## 2018-02-15 ENCOUNTER — Encounter: Payer: Self-pay | Admitting: Osteopathic Medicine

## 2018-02-20 ENCOUNTER — Other Ambulatory Visit: Payer: Self-pay | Admitting: Osteopathic Medicine

## 2018-02-20 DIAGNOSIS — E538 Deficiency of other specified B group vitamins: Secondary | ICD-10-CM

## 2018-02-22 ENCOUNTER — Other Ambulatory Visit: Payer: Self-pay | Admitting: Sports Medicine

## 2018-02-22 DIAGNOSIS — Z79899 Other long term (current) drug therapy: Secondary | ICD-10-CM

## 2018-02-22 DIAGNOSIS — F99 Mental disorder, not otherwise specified: Secondary | ICD-10-CM

## 2018-02-22 NOTE — Telephone Encounter (Signed)
To PCP

## 2018-02-27 LAB — POCT INR: INR: 2.1 — AB (ref 0.9–1.1)

## 2018-03-07 ENCOUNTER — Encounter: Payer: Self-pay | Admitting: Osteopathic Medicine

## 2018-03-07 LAB — POCT INR
INR: 2 — AB (ref 0.9–1.1)
INR: 2.1 — AB (ref 0.9–1.1)

## 2018-03-07 LAB — PROTIME-INR

## 2018-03-09 ENCOUNTER — Encounter: Payer: Self-pay | Admitting: Osteopathic Medicine

## 2018-03-15 LAB — POCT INR
INR: 2 — AB (ref 0.9–1.1)
INR: 2.1 — AB (ref 0.9–1.1)

## 2018-03-16 ENCOUNTER — Other Ambulatory Visit: Payer: Self-pay | Admitting: Osteopathic Medicine

## 2018-03-16 DIAGNOSIS — G4700 Insomnia, unspecified: Secondary | ICD-10-CM

## 2018-03-16 DIAGNOSIS — G2581 Restless legs syndrome: Secondary | ICD-10-CM

## 2018-03-16 DIAGNOSIS — D509 Iron deficiency anemia, unspecified: Secondary | ICD-10-CM

## 2018-03-16 DIAGNOSIS — K5900 Constipation, unspecified: Secondary | ICD-10-CM

## 2018-03-20 ENCOUNTER — Encounter: Payer: Self-pay | Admitting: Osteopathic Medicine

## 2018-03-23 LAB — POCT INR: INR: 2.2 — AB (ref 0.9–1.1)

## 2018-03-30 ENCOUNTER — Encounter: Payer: Medicare Other | Admitting: Osteopathic Medicine

## 2018-03-30 ENCOUNTER — Telehealth: Payer: Self-pay | Admitting: Osteopathic Medicine

## 2018-03-30 LAB — POCT INR: INR: 2.2 — AB (ref 0.9–1.1)

## 2018-03-30 NOTE — Telephone Encounter (Signed)
Please call patient: she did not show to her appointment today. This is her  second no-show in the past year. Please call to make sure everything is ok and see if she would like to reschedule. Please remind her of clinic policy that 3 no-shows in 1 year will result in dismissal from the practice, to protect access to appointments for all our patients.   NO SHOW - cancelled a 10:30 appt at 10:08 same day 09/20/17 NO SHOW - to physical 03/30/18

## 2018-03-31 LAB — PROTIME-INR

## 2018-04-06 LAB — POCT INR
INR: 2.1 — AB (ref 0.9–1.1)
INR: 2.1 — AB (ref 0.9–1.1)

## 2018-04-10 ENCOUNTER — Ambulatory Visit (INDEPENDENT_AMBULATORY_CARE_PROVIDER_SITE_OTHER): Payer: Medicare Other | Admitting: Family Medicine

## 2018-04-10 DIAGNOSIS — Z86718 Personal history of other venous thrombosis and embolism: Secondary | ICD-10-CM

## 2018-04-10 LAB — POCT INR: INR: 2.1 (ref 2.0–3.0)

## 2018-04-10 NOTE — Progress Notes (Signed)
Requesting clarification - any changes to current daily warfarin regimen? Thanks.

## 2018-04-10 NOTE — Progress Notes (Signed)
As per covering provider - INR flow sheet added into pt's chart.

## 2018-04-13 ENCOUNTER — Other Ambulatory Visit: Payer: Self-pay | Admitting: Osteopathic Medicine

## 2018-04-13 DIAGNOSIS — E538 Deficiency of other specified B group vitamins: Secondary | ICD-10-CM

## 2018-04-13 DIAGNOSIS — Z86718 Personal history of other venous thrombosis and embolism: Secondary | ICD-10-CM

## 2018-04-13 NOTE — Telephone Encounter (Signed)
Please review for refill- patient at [facility] 

## 2018-04-14 LAB — POCT INR: INR: 2.1 — AB (ref 0.9–1.1)

## 2018-04-17 NOTE — Progress Notes (Deleted)
Subjective:   Yolanda Howell is a 62 y.o. female who presents for an Initial Medicare Annual Wellness Visit.  Review of Systems    No ROS.  Medicare Wellness Visit. Additional risk factors are reflected in the social history.     Sleep patterns:    Home Safety/Smoke Alarms: Feels safe in home. Smoke alarms in place.  Living environment;  .   Female:   Pap-       Mammo-       Dexa scan-  utd      CCS-     Objective:    There were no vitals filed for this visit. There is no height or weight on file to calculate BMI.  No flowsheet data found.  Current Medications (verified) Outpatient Encounter Medications as of 04/18/2018  Medication Sig  . clonazePAM (KLONOPIN) 0.5 MG tablet TAKE ONE-HALF TABLET BY MOUTH THREE TIMES DAILY AS NEEDED FOR anxiety  . fenofibrate (TRICOR) 145 MG tablet TAKE ONE TABLET BY MOUTH DAILY  . fentaNYL (DURAGESIC - DOSED MCG/HR) 75 MCG/HR Place onto the skin.  . ferrous sulfate 325 (65 FE) MG tablet TAKE 1 TABLET ONCE A DAY  . hydrOXYzine (ATARAX/VISTARIL) 50 MG tablet TAKE TWO TABLETS BY MOUTH at bed time AS NEEDED (help with SLEEP)  . Melatonin 3 MG TABS Take 3 mg by mouth at bedtime.  . metoprolol succinate (TOPROL-XL) 100 MG 24 hr tablet TAKE 1 TABLET ONCE A DAY  . mirtazapine (REMERON) 45 MG tablet Take 45 mg by mouth at bedtime.   . risperiDONE (RISPERDAL) 3 MG tablet Take 3 mg by mouth at bedtime.   Marland Kitchen rOPINIRole (REQUIP) 1 MG tablet TAKE ONE TABLET BY MOUTH TWICE DAILY  . STOOL SOFTENER 100 MG capsule TAKE 1-2 CAPSULE BY MOUTH EVERY DAY  . vitamin B-12 (CYANOCOBALAMIN) 1000 MCG tablet TAKE ONE TABLET BY MOUTH DAILY  . warfarin (COUMADIN) 5 MG tablet TAKE ONE AND ONE-HALF TABLET ON tuesday, friday AND sunday. ONE TABLET ON ALL other DAYS   No facility-administered encounter medications on file as of 04/18/2018.     Allergies (verified) Azithromycin   History: Past Medical History:  Diagnosis Date  . B12 deficiency 01/25/2012  .  Bipolar disorder (HCC) 08/02/2017  . History of DVT (deep vein thrombosis) 08/02/2017  . History of GI bleed 12/30/2011  . Iron deficiency anemia 11/06/2014  . Major depressive disorder, recurrent episode (HCC) 09/06/2011  . Schizoaffective disorder (HCC) 08/31/2011   No past surgical history on file. No family history on file. Social History   Socioeconomic History  . Marital status: Single    Spouse name: Not on file  . Number of children: Not on file  . Years of education: Not on file  . Highest education level: Not on file  Occupational History  . Not on file  Social Needs  . Financial resource strain: Not on file  . Food insecurity:    Worry: Not on file    Inability: Not on file  . Transportation needs:    Medical: Not on file    Non-medical: Not on file  Tobacco Use  . Smoking status: Current Every Day Smoker  . Smokeless tobacco: Never Used  Substance and Sexual Activity  . Alcohol use: No    Frequency: Never  . Drug use: No  . Sexual activity: Never  Lifestyle  . Physical activity:    Days per week: Not on file    Minutes per session: Not on file  .  Stress: Not on file  Relationships  . Social connections:    Talks on phone: Not on file    Gets together: Not on file    Attends religious service: Not on file    Active member of club or organization: Not on file    Attends meetings of clubs or organizations: Not on file    Relationship status: Not on file  Other Topics Concern  . Not on file  Social History Narrative  . Not on file    Tobacco Counseling Ready to quit: Not Answered Counseling given: Not Answered   Clinical Intake:                        Activities of Daily Living No flowsheet data found.   Immunizations and Health Maintenance Immunization History  Administered Date(s) Administered  . Influenza Split 04/12/2015  . Influenza-Unspecified 05/12/2017   Health Maintenance Due  Topic Date Due  . Hepatitis C Screening   18-Jan-1956  . HIV Screening  04/05/1971  . TETANUS/TDAP  04/05/1975  . PAP SMEAR  04/04/1977  . COLONOSCOPY  04/04/2006  . MAMMOGRAM  08/19/2017  . DEXA SCAN  08/19/2017    Patient Care Team: Yolanda Nielsen, DO as PCP - General (Osteopathic Medicine)  Indicate any recent Medical Services you may have received from other than [facility] providers in the past year (date may be approximate).     Assessment:   This is a routine wellness examination for [facility]. Physical assessment deferred to PCP.   Hearing/Vision screen No exam data present  Dietary issues and exercise activities discussed:   Diet Breakfast: Lunch:  Dinner:       Goals   None    Depression Screen PHQ 2/9 Scores 08/02/2017  PHQ - 2 Score 0  PHQ- 9 Score 4    Fall Risk No flowsheet data found.  Is the patient's home free of loose throw rugs in walkways, pet beds, electrical cords, etc?   {Blank single:19197::"yes","no"}      Grab bars in the bathroom? {Blank single:19197::"yes","no"}      Handrails on the stairs?   {Blank single:19197::"yes","no"}      Adequate lighting?   {Blank single:19197::"yes","no"}  Cognitive Function:        Screening Tests Health Maintenance  Topic Date Due  . Hepatitis C Screening  10/30/1955  . HIV Screening  04/05/1971  . TETANUS/TDAP  04/05/1975  . PAP SMEAR  04/04/1977  . COLONOSCOPY  04/04/2006  . MAMMOGRAM  08/19/2017  . DEXA SCAN  08/19/2017  . INFLUENZA VACCINE  10/31/2018 (Originally 02/09/2018)      Plan:   ***  I have personally reviewed and noted the following in the patient's chart:   . Medical and social history . Use of alcohol, tobacco or illicit drugs  . Current medications and supplements . Functional ability and status . Nutritional status . Physical activity . Advanced directives . List of other physicians . Hospitalizations, surgeries, and ER visits in previous 12 months . Vitals . Screenings to include cognitive, depression, and  falls . Referrals and appointments  In addition, I have reviewed and discussed with patient certain preventive protocols, quality metrics, and best practice recommendations. A written personalized care plan for preventive services as well as general preventive health recommendations were provided to patient.     Normand Sloop, LPN   16/07/958

## 2018-04-18 ENCOUNTER — Ambulatory Visit: Payer: Medicare Other

## 2018-04-20 ENCOUNTER — Encounter: Payer: Self-pay | Admitting: Family Medicine

## 2018-04-20 ENCOUNTER — Ambulatory Visit (INDEPENDENT_AMBULATORY_CARE_PROVIDER_SITE_OTHER): Payer: Medicare Other | Admitting: Family Medicine

## 2018-04-20 VITALS — BP 117/44 | HR 71 | Wt 241.0 lb

## 2018-04-20 DIAGNOSIS — E538 Deficiency of other specified B group vitamins: Secondary | ICD-10-CM | POA: Diagnosis not present

## 2018-04-20 DIAGNOSIS — Z5181 Encounter for therapeutic drug level monitoring: Secondary | ICD-10-CM

## 2018-04-20 DIAGNOSIS — Z114 Encounter for screening for human immunodeficiency virus [HIV]: Secondary | ICD-10-CM

## 2018-04-20 DIAGNOSIS — E782 Mixed hyperlipidemia: Secondary | ICD-10-CM

## 2018-04-20 DIAGNOSIS — K257 Chronic gastric ulcer without hemorrhage or perforation: Secondary | ICD-10-CM | POA: Diagnosis not present

## 2018-04-20 DIAGNOSIS — R5383 Other fatigue: Secondary | ICD-10-CM

## 2018-04-20 DIAGNOSIS — Z23 Encounter for immunization: Secondary | ICD-10-CM | POA: Diagnosis not present

## 2018-04-20 DIAGNOSIS — Z1159 Encounter for screening for other viral diseases: Secondary | ICD-10-CM

## 2018-04-20 DIAGNOSIS — Z7901 Long term (current) use of anticoagulants: Secondary | ICD-10-CM

## 2018-04-20 DIAGNOSIS — Z79899 Other long term (current) drug therapy: Secondary | ICD-10-CM | POA: Diagnosis not present

## 2018-04-20 DIAGNOSIS — E785 Hyperlipidemia, unspecified: Secondary | ICD-10-CM | POA: Insufficient documentation

## 2018-04-20 DIAGNOSIS — D509 Iron deficiency anemia, unspecified: Secondary | ICD-10-CM

## 2018-04-20 LAB — POCT INR: INR: 2.1 — AB (ref 0.9–1.1)

## 2018-04-20 NOTE — Progress Notes (Signed)
Yolanda Howell is a 62 y.o. female who presents to [facility] Yolanda Howell: Primary Care Sports Medicine today for fatigue.  Yolanda Howell notes a several month history of fatigue.  She notes feeling tired and having little energy.  She denies chest pain or palpitations.  She denies severe dyspnea on exertion leg swelling orthopnea.  She notes this is slowly been worsening.  She has a history of GI bleed resulting in iron deficiency anemia.  She takes warfarin daily and gets her INR checked and is notes that it usually in the normal limits.  She denies any blood in the stool or melanotic stool.   ROS as above:  Exam:  BP (!) 117/44   Pulse 71   Wt 241 lb (109.3 kg)   BMI 38.90 kg/m  Wt Readings from Last 5 Encounters:  04/20/18 241 lb (109.3 kg)  09/27/17 213 lb (96.6 kg)  08/02/17 210 lb 0.6 oz (95.3 kg)    Gen: Well NAD HEENT: EOMI,  MMM Lungs: Normal work of breathing. CTABL Heart: RRR no MRG Abd: NABS, Soft. Nondistended, Nontender Exts: Brisk capillary refill, warm and well perfused.  Neuro: Lower extremity strength is equal normal throughout.  Upper extremity strength equal normal throughout. Reflexes all 4 extremities equal and normal.  Sensation intact throughout.  Normal balance gait coordination.  Lab and Radiology Results Lab Results  Component Value Date   INR 2.1 04/06/2018   INR 2.1 (A) 03/15/2018   INR 2.0 (A) 03/07/2018     Twelve-lead EKG shows normal sinus rhythm at 67 bpm.  No ST segment elevation or depression.  Flattened lateral precordial T waves present.  No prior EKG to compare to but not particularly changed based on EKG report from outside hospital previously.  However imaging of the EKG report is not available    Assessment and Plan: 62 y.o. female with fatigue.  Unclear etiology.  Patient has multiple medications that could cause fatigue and has multiple medical  problems that could contribute to fatigue.  She does have a history of GI bleed and anemia and is on warfarin.  Plan for metabolic work-up listed below.  Additionally will screen for HIV and hepatitis C.  EKG is slightly abnormal however not significantly changed from prior descriptions.  If no other explanation would pursue heart work-up as the next step.  Recheck with PCP in 2 to 4 weeks.  Return sooner if needed.  Flu vaccine given today prior to discharge.   Orders Placed This Encounter  Procedures  . Flu Vaccine QUAD 36+ mos IM  . Vitamin B12  . COMPLETE METABOLIC PANEL WITH GFR  . Lipid Panel w/reflex Direct LDL  . CBC  . TSH  . Hemoglobin A1c  . Fe+TIBC+Fer  . HIV Antibody (routine testing w rflx)  . Hepatitis C antibody  . INR/PT  . EKG 12-Lead   No orders of the defined types were placed in this encounter.    Historical information moved to improve visibility of documentation.  Past Medical History:  Diagnosis Date  . B12 deficiency 01/25/2012  . Bipolar disorder (HCC) 08/02/2017  . History of DVT (deep vein thrombosis) 08/02/2017  . History of GI bleed 12/30/2011  . Iron deficiency anemia 11/06/2014  . Major depressive disorder, recurrent episode (HCC) 09/06/2011  . Schizoaffective disorder (HCC) 08/31/2011   No past surgical history on file. Social History   Tobacco Use  . Smoking status: Current Every Day Smoker  . Smokeless  tobacco: Never Used  Substance Use Topics  . Alcohol use: No    Frequency: Never   family history is not on file.  Medications: Current Outpatient Medications  Medication Sig Dispense Refill  . clonazePAM (KLONOPIN) 0.5 MG tablet TAKE ONE-HALF TABLET BY MOUTH THREE TIMES DAILY AS NEEDED FOR anxiety 45 tablet 2  . fenofibrate (TRICOR) 145 MG tablet TAKE ONE TABLET BY MOUTH DAILY 90 tablet 1  . ferrous sulfate 325 (65 FE) MG tablet TAKE 1 TABLET ONCE A DAY 30 tablet 1  . hydrOXYzine (ATARAX/VISTARIL) 50 MG tablet TAKE TWO TABLETS BY  MOUTH at bed time AS NEEDED (help with SLEEP) 60 tablet 3  . Melatonin 3 MG TABS Take 3 mg by mouth at bedtime.    . metoprolol succinate (TOPROL-XL) 100 MG 24 hr tablet TAKE 1 TABLET ONCE A DAY 30 tablet 1  . mirtazapine (REMERON) 45 MG tablet Take 45 mg by mouth at bedtime.     . risperiDONE (RISPERDAL) 3 MG tablet Take 3 mg by mouth at bedtime.     Marland Kitchen rOPINIRole (REQUIP) 1 MG tablet Take 1 mg by mouth at bedtime.    . STOOL SOFTENER 100 MG capsule TAKE 1-2 CAPSULE BY MOUTH EVERY DAY 60 capsule 1  . vitamin B-12 (CYANOCOBALAMIN) 1000 MCG tablet TAKE ONE TABLET BY MOUTH DAILY 30 tablet 1  . warfarin (COUMADIN) 5 MG tablet TAKE ONE AND ONE-HALF TABLET ON tuesday, friday AND sunday. ONE TABLET ON ALL other DAYS 90 tablet 1  . fentaNYL (DURAGESIC - DOSED MCG/HR) 50 MCG/HR PLACE ONE PATCH ONTO SKIN EVERY 72 HOURS FOR 30 DAYS  0   No current facility-administered medications for this visit.    Allergies  Allergen Reactions  . Azithromycin Hives     Discussed warning signs or symptoms. Please see discharge instructions. Patient expresses understanding.

## 2018-04-20 NOTE — Patient Instructions (Signed)
Thank you for coming in today. Get labs today.  Follow up with Dr Lyn Hollingshead in 2-3 weeks for recheck.  Return sooner if needed.   Call or go to the emergency room if you get worse, have trouble breathing, have chest pains, or palpitations.    Fatigue Fatigue is feeling tired all of the time, a lack of energy, or a lack of motivation. Occasional or mild fatigue is often a normal response to activity or life in general. However, long-lasting (chronic) or extreme fatigue may indicate an underlying medical condition. Follow these instructions at home: Watch your fatigue for any changes. The following actions may help to lessen any discomfort you are feeling:  Talk to your health care provider about how much sleep you need each night. Try to get the required amount every night.  Take medicines only as directed by your health care provider.  Eat a healthy and nutritious diet. Ask your health care provider if you need help changing your diet.  Drink enough fluid to keep your urine clear or pale yellow.  Practice ways of relaxing, such as yoga, meditation, massage therapy, or acupuncture.  Exercise regularly.  Change situations that cause you stress. Try to keep your work and personal routine reasonable.  Do not abuse illegal drugs.  Limit alcohol intake to no more than 1 drink per day for nonpregnant women and 2 drinks per day for men. One drink equals 12 ounces of beer, 5 ounces of wine, or 1 ounces of hard liquor.  Take a multivitamin, if directed by your health care provider.  Contact a health care provider if:  Your fatigue does not get better.  You have a fever.  You have unintentional weight loss or gain.  You have headaches.  You have difficulty: ? Falling asleep. ? Sleeping throughout the night.  You feel angry, guilty, anxious, or sad.  You are unable to have a bowel movement (constipation).  You skin is dry.  Your legs or another part of your body is  swollen. Get help right away if:  You feel confused.  Your vision is blurry.  You feel faint or pass out.  You have a severe headache.  You have severe abdominal, pelvic, or back pain.  You have chest pain, shortness of breath, or an irregular or fast heartbeat.  You are unable to urinate or you urinate less than normal.  You develop abnormal bleeding, such as bleeding from the rectum, vagina, nose, lungs, or nipples.  You vomit blood.  You have thoughts about harming yourself or committing suicide.  You are worried that you might harm someone else. This information is not intended to replace advice given to you by your health care provider. Make sure you discuss any questions you have with your health care provider. Document Released: 04/25/2007 Document Revised: 12/04/2015 Document Reviewed: 10/30/2013 Elsevier Interactive Patient Education  [employer].

## 2018-04-21 ENCOUNTER — Encounter: Payer: Self-pay | Admitting: Osteopathic Medicine

## 2018-04-21 LAB — PROTIME-INR
INR: 2.4 — AB
Prothrombin Time: 23.6 s — ABNORMAL HIGH (ref 9.0–11.5)

## 2018-04-24 LAB — COMPLETE METABOLIC PANEL WITH GFR
AG Ratio: 1.6 (calc) (ref 1.0–2.5)
ALBUMIN MSPROF: 4.1 g/dL (ref 3.6–5.1)
ALT: 11 U/L (ref 6–29)
AST: 14 U/L (ref 10–35)
Alkaline phosphatase (APISO): 50 U/L (ref 33–130)
BUN: 18 mg/dL (ref 7–25)
CO2: 29 mmol/L (ref 20–32)
CREATININE: 0.75 mg/dL (ref 0.50–0.99)
Calcium: 9.2 mg/dL (ref 8.6–10.4)
Chloride: 102 mmol/L (ref 98–110)
GFR, Est African American: 99 mL/min/{1.73_m2} (ref >=60)
GFR, Est Non African American: 85 mL/min/{1.73_m2} (ref >=60)
GLUCOSE: 100 mg/dL (ref 65–139)
Globulin: 2.6 g/dL (calc) (ref 1.9–3.7)
Potassium: 4.8 mmol/L (ref 3.5–5.3)
Sodium: 136 mmol/L (ref 135–146)
TOTAL PROTEIN: 6.7 g/dL (ref 6.1–8.1)
Total Bilirubin: 0.4 mg/dL (ref 0.2–1.2)

## 2018-04-24 LAB — HEPATITIS C ANTIBODY
Hepatitis C Ab: REACTIVE — AB
SIGNAL TO CUT-OFF: 2.91 — AB (ref <1.00)

## 2018-04-24 LAB — CBC
HCT: 39.4 % (ref 35.0–45.0)
Hemoglobin: 13.7 g/dL (ref 11.7–15.5)
MCH: 31.3 pg (ref 27.0–33.0)
MCHC: 34.8 g/dL (ref 32.0–36.0)
MCV: 90 fL (ref 80.0–100.0)
MPV: 9.7 fL (ref 7.5–12.5)
PLATELETS: 224 10*3/uL (ref 140–400)
RBC: 4.38 10*6/uL (ref 3.80–5.10)
RDW: 13.8 % (ref 11.0–15.0)
WBC: 5 10*3/uL (ref 3.8–10.8)

## 2018-04-24 LAB — HIV ANTIBODY (ROUTINE TESTING W REFLEX): HIV 1&2 Ab, 4th Generation: NONREACTIVE

## 2018-04-24 LAB — HEMOGLOBIN A1C
EAG (MMOL/L): 6.2 (calc)
Hgb A1c MFr Bld: 5.5 % of total Hgb (ref <5.7)
Mean Plasma Glucose: 111 (calc)

## 2018-04-24 LAB — IRON,TIBC AND FERRITIN PANEL
%SAT: 48 % — AB (ref 16–45)
FERRITIN: 43 ng/mL (ref 16–288)
IRON: 152 ug/dL (ref 45–160)
TIBC: 319 mcg/dL (calc) (ref 250–450)

## 2018-04-24 LAB — LIPID PANEL W/REFLEX DIRECT LDL
CHOL/HDL RATIO: 5.3 (calc) — AB (ref <5.0)
Cholesterol: 181 mg/dL (ref <200)
HDL: 34 mg/dL — AB (ref >50)
LDL Cholesterol (Calc): 113 mg/dL (calc) — ABNORMAL HIGH
NON-HDL CHOLESTEROL (CALC): 147 mg/dL — AB (ref <130)
Triglycerides: 222 mg/dL — ABNORMAL HIGH (ref <150)

## 2018-04-24 LAB — VITAMIN B12: VITAMIN B 12: 1096 pg/mL (ref 200–1100)

## 2018-04-24 LAB — HCV RNA,QUANTITATIVE REAL TIME PCR
HCV QUANT LOG: NOT DETECTED {Log_IU}/mL
HCV RNA, PCR, QN: NOT DETECTED [IU]/mL

## 2018-04-24 LAB — TSH: TSH: 2.92 m[IU]/L (ref 0.40–4.50)

## 2018-04-25 NOTE — Progress Notes (Deleted)
Subjective:   Yolanda Howell is a 62 y.o. female who presents for an Initial Medicare Annual Wellness Visit.  Review of Systems    No ROS.  Medicare Wellness Visit. Additional risk factors are reflected in the social history.     Sleep patterns:   Home Safety/Smoke Alarms: Feels safe in home. Smoke alarms in place.  Living environment;  .   Female:   Pap-       Mammo-       Dexa scan-        CCS-     Objective:    There were no vitals filed for this visit. There is no height or weight on file to calculate BMI.  No flowsheet data found.  Current Medications (verified) Outpatient Encounter Medications as of 05/03/2018  Medication Sig  . clonazePAM (KLONOPIN) 0.5 MG tablet TAKE ONE-HALF TABLET BY MOUTH THREE TIMES DAILY AS NEEDED FOR anxiety  . fenofibrate (TRICOR) 145 MG tablet TAKE ONE TABLET BY MOUTH DAILY  . fentaNYL (DURAGESIC - DOSED MCG/HR) 50 MCG/HR PLACE ONE PATCH ONTO SKIN EVERY 72 HOURS FOR 30 DAYS  . ferrous sulfate 325 (65 FE) MG tablet TAKE 1 TABLET ONCE A DAY  . hydrOXYzine (ATARAX/VISTARIL) 50 MG tablet TAKE TWO TABLETS BY MOUTH at bed time AS NEEDED (help with SLEEP)  . Melatonin 3 MG TABS Take 3 mg by mouth at bedtime.  . metoprolol succinate (TOPROL-XL) 100 MG 24 hr tablet TAKE 1 TABLET ONCE A DAY  . mirtazapine (REMERON) 45 MG tablet Take 45 mg by mouth at bedtime.   . risperiDONE (RISPERDAL) 3 MG tablet Take 3 mg by mouth at bedtime.   Marland Kitchen rOPINIRole (REQUIP) 1 MG tablet Take 1 mg by mouth at bedtime.  . STOOL SOFTENER 100 MG capsule TAKE 1-2 CAPSULE BY MOUTH EVERY DAY  . vitamin B-12 (CYANOCOBALAMIN) 1000 MCG tablet TAKE ONE TABLET BY MOUTH DAILY  . warfarin (COUMADIN) 5 MG tablet TAKE ONE AND ONE-HALF TABLET ON tuesday, friday AND sunday. ONE TABLET ON ALL other DAYS   No facility-administered encounter medications on file as of 05/03/2018.     Allergies (verified) Azithromycin   History: Past Medical History:  Diagnosis Date  . B12  deficiency 01/25/2012  . Bipolar disorder (HCC) 08/02/2017  . History of DVT (deep vein thrombosis) 08/02/2017  . History of GI bleed 12/30/2011  . Iron deficiency anemia 11/06/2014  . Major depressive disorder, recurrent episode (HCC) 09/06/2011  . Schizoaffective disorder (HCC) 08/31/2011   No past surgical history on file. No family history on file. Social History   Socioeconomic History  . Marital status: Single    Spouse name: Not on file  . Number of children: Not on file  . Years of education: Not on file  . Highest education level: Not on file  Occupational History  . Not on file  Social Needs  . Financial resource strain: Not on file  . Food insecurity:    Worry: Not on file    Inability: Not on file  . Transportation needs:    Medical: Not on file    Non-medical: Not on file  Tobacco Use  . Smoking status: Current Every Day Smoker  . Smokeless tobacco: Never Used  Substance and Sexual Activity  . Alcohol use: No    Frequency: Never  . Drug use: No  . Sexual activity: Never  Lifestyle  . Physical activity:    Days per week: Not on file    Minutes  per session: Not on file  . Stress: Not on file  Relationships  . Social connections:    Talks on phone: Not on file    Gets together: Not on file    Attends religious service: Not on file    Active member of club or organization: Not on file    Attends meetings of clubs or organizations: Not on file    Relationship status: Not on file  Other Topics Concern  . Not on file  Social History Narrative  . Not on file    Tobacco Counseling Ready to quit: Not Answered Counseling given: Not Answered   Clinical Intake:                        Activities of Daily Living No flowsheet data found.   Immunizations and Health Maintenance Immunization History  Administered Date(s) Administered  . Influenza Split 04/12/2015  . Influenza,inj,Quad PF,6+ Mos 04/26/2017, 04/20/2018  . Influenza-Unspecified  05/12/2017   Health Maintenance Due  Topic Date Due  . TETANUS/TDAP  04/05/1975  . PAP SMEAR  04/04/1977  . COLONOSCOPY  04/04/2006  . MAMMOGRAM  08/19/2017  . DEXA SCAN  08/19/2017    Patient Care Team: Sunnie Nielsen, DO as PCP - General (Osteopathic Medicine)  Indicate any recent Medical Services you may have received from other than [facility] providers in the past year (date may be approximate).     Assessment:   This is a routine wellness examination for [facility]. Physical assessment deferred to PCP.   Hearing/Vision screen No exam data present  Dietary issues and exercise activities discussed:   Diet  Breakfast: Lunch:  Dinner:       Goals   None    Depression Screen PHQ 2/9 Scores 08/02/2017  PHQ - 2 Score 0  PHQ- 9 Score 4    Fall Risk No flowsheet data found.  Is the patient's home free of loose throw rugs in walkways, pet beds, electrical cords, etc?   {Blank single:19197::"yes","no"}      Grab bars in the bathroom? {Blank single:19197::"yes","no"}      Handrails on the stairs?   {Blank single:19197::"yes","no"}      Adequate lighting?   {Blank single:19197::"yes","no"}   Cognitive Function:        Screening Tests Health Maintenance  Topic Date Due  . TETANUS/TDAP  04/05/1975  . PAP SMEAR  04/04/1977  . COLONOSCOPY  04/04/2006  . MAMMOGRAM  08/19/2017  . DEXA SCAN  08/19/2017  . INFLUENZA VACCINE  Completed  . Hepatitis C Screening  Completed  . HIV Screening  Completed      Plan:   ***  I have personally reviewed and noted the following in the patient's chart:   . Medical and social history . Use of alcohol, tobacco or illicit drugs  . Current medications and supplements . Functional ability and status . Nutritional status . Physical activity . Advanced directives . List of other physicians . Hospitalizations, surgeries, and ER visits in previous 12 months . Vitals . Screenings to include cognitive, depression, and  falls . Referrals and appointments  In addition, I have reviewed and discussed with patient certain preventive protocols, quality metrics, and best practice recommendations. A written personalized care plan for preventive services as well as general preventive health recommendations were provided to patient.     Normand Sloop, LPN   16/04/9603

## 2018-05-03 ENCOUNTER — Ambulatory Visit: Payer: Medicare Other

## 2018-05-03 ENCOUNTER — Ambulatory Visit (INDEPENDENT_AMBULATORY_CARE_PROVIDER_SITE_OTHER): Payer: Medicare Other | Admitting: Osteopathic Medicine

## 2018-05-03 ENCOUNTER — Encounter: Payer: Self-pay | Admitting: Osteopathic Medicine

## 2018-05-03 VITALS — BP 133/67 | HR 57 | Temp 98.3°F | Wt 237.0 lb

## 2018-05-03 DIAGNOSIS — Z79899 Other long term (current) drug therapy: Secondary | ICD-10-CM | POA: Diagnosis not present

## 2018-05-03 DIAGNOSIS — R5383 Other fatigue: Secondary | ICD-10-CM | POA: Diagnosis not present

## 2018-05-03 DIAGNOSIS — Z23 Encounter for immunization: Secondary | ICD-10-CM | POA: Diagnosis not present

## 2018-05-03 DIAGNOSIS — F99 Mental disorder, not otherwise specified: Secondary | ICD-10-CM

## 2018-05-03 MED ORDER — CLONAZEPAM 0.5 MG PO TABS: 0.2500 mg | ORAL_TABLET | Freq: Three times a day (TID) | ORAL | 2 refills | Status: DC | PRN

## 2018-05-03 NOTE — Progress Notes (Signed)
Subjective:   Yolanda Howell is a 62 y.o. female who presents for an Initial Medicare Annual Wellness Visit.  Review of Systems    No ROS.  Medicare Wellness Visit. Additional risk factors are reflected in the social history.   Cardiac Risk Factors include: dyslipidemia;advanced age (>32men, >5 women);obesity (BMI >30kg/m2);sedentary lifestyle Sleep patterns: getting 6 hours of sleep during the night. Wakes up during the night to go to the bathroom 3-4 times  Home Safety/Smoke Alarms: Feels safe in home. Smoke alarms in place.  Living environment; Lives alone in apartment. No stairs in the home. Shower is a step over tub with grab rails in place    Female:   Pap- aged out      Mammo- utd      Dexa scan-pt reports 2 years ago at [facility]        CCS-patient reports 2-3 years ago at [facility]      Objective:    Today's Vitals   05/17/18 0802  BP: 120/64  Pulse: 68  SpO2: 90%  Weight: 240 lb (108.9 kg)  Height: 5\' 6"  (1.676 m)  PainSc: 5    Body mass index is 38.74 kg/m.  Advanced Directives 05/17/2018  Does Patient Have a Medical Advance Directive? Yes  Does patient want to make changes to medical advance directive? No - Patient declined    Current Medications (verified) Outpatient Encounter Medications as of 05/17/2018  Medication Sig  . clonazePAM (KLONOPIN) 0.5 MG tablet Take 0.5 tablets (0.25 mg total) by mouth 3 (three) times daily as needed for anxiety.  . fenofibrate (TRICOR) 145 MG tablet TAKE ONE TABLET BY MOUTH DAILY  . fentaNYL (DURAGESIC - DOSED MCG/HR) 50 MCG/HR PLACE ONE PATCH ONTO SKIN EVERY 72 HOURS FOR 30 DAYS  . ferrous sulfate 325 (65 FE) MG tablet TAKE 1 TABLET ONCE A DAY  . hydrOXYzine (ATARAX/VISTARIL) 50 MG tablet TAKE TWO TABLETS BY MOUTH at bed time AS NEEDED (help with SLEEP)  . Melatonin 3 MG TABS Take 3 mg by mouth at bedtime.  . metoprolol succinate (TOPROL-XL) 100 MG 24 hr tablet TAKE 1 TABLET ONCE A DAY  . mirtazapine (REMERON) 45 MG tablet  Take 45 mg by mouth at bedtime.   . risperiDONE (RISPERDAL) 3 MG tablet Take 3 mg by mouth at bedtime.   Marland Kitchen rOPINIRole (REQUIP) 1 MG tablet Take 1 mg by mouth at bedtime.  . STOOL SOFTENER 100 MG capsule TAKE 1-2 CAPSULE BY MOUTH EVERY DAY  . vitamin B-12 (CYANOCOBALAMIN) 1000 MCG tablet TAKE ONE TABLET BY MOUTH DAILY  . warfarin (COUMADIN) 5 MG tablet TAKE ONE AND ONE-HALF TABLET ON tuesday, friday AND sunday. ONE TABLET ON ALL other DAYS   No facility-administered encounter medications on file as of 05/17/2018.     Allergies (verified) Azithromycin and Pollen extract   History: Past Medical History:  Diagnosis Date  . B12 deficiency 01/25/2012  . Bipolar disorder (HCC) 08/02/2017  . History of DVT (deep vein thrombosis) 08/02/2017  . History of GI bleed 12/30/2011  . Iron deficiency anemia 11/06/2014  . Major depressive disorder, recurrent episode (HCC) 09/06/2011  . Schizoaffective disorder (HCC) 08/31/2011   History reviewed. No pertinent surgical history. History reviewed. No pertinent family history. Social History   Socioeconomic History  . Marital status: Single    Spouse name: Not on file  . Number of children: 0  . Years of education: 16  . Highest education level: Bachelor's degree (e.g., BA, AB, BS)  Occupational  History  . Occupation: retired    Comment: Runner, broadcasting/film/video  Social Needs  . Financial resource strain: Not hard at all  . Food insecurity:    Worry: Never true    Inability: Never true  . Transportation needs:    Medical: No    Non-medical: No  Tobacco Use  . Smoking status: Current Every Day Smoker    Packs/day: 0.25  . Smokeless tobacco: Never Used  Substance and Sexual Activity  . Alcohol use: No    Frequency: Never  . Drug use: No  . Sexual activity: Never  Lifestyle  . Physical activity:    Days per week: 2 days    Minutes per session: 10 min  . Stress: Not at all  Relationships  . Social connections:    Talks on phone: More than three times a  week    Gets together: More than three times a week    Attends religious service: Never    Active member of club or organization: No    Attends meetings of clubs or organizations: Never    Relationship status: Never married  Other Topics Concern  . 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 Read during the day and watches TV. Walks some days.    Tobacco Counseling Ready to quit: Not Answered Counseling given: Not Answered   Clinical Intake:  Pre-visit preparation completed: Yes  Pain : 0-10 Pain Score: 5  Pain Type: Chronic pain Pain Location: Back Pain Orientation: Lower Pain Radiating Towards: no radiating Pain Descriptors / Indicators: Constant, Aching Pain Onset: More than a month ago Pain Frequency: Intermittent Pain Relieving Factors: Duragesic Patch and Tylenol  Pain Relieving Factors: Duragesic Patch and Tylenol  Nutritional Risks: None Diabetes: No  How often do you need to have someone help you when you read instructions, pamphlets, or other written materials from your doctor or pharmacy?: 1 - Never What is the last grade level you completed in school?: 16  Interpreter Needed?: No  Information entered by :: Carolin Sicks, LPN   Activities of Daily Living In your present state of health, do you have any difficulty performing the following activities: 05/17/2018  Hearing? N  Vision? N  Difficulty concentrating or making decisions? N  Walking or climbing stairs? N  Dressing or bathing? N  Doing errands, shopping? N  Preparing Food and eating ? N  Using the Toilet? N  In the past six months, have you accidently leaked urine? N  Do you have problems with loss of bowel control? N  Managing your Medications? N  Managing your Finances? N  Housekeeping or managing your Housekeeping? N  Some recent data might be hidden     Immunizations and Health Maintenance Immunization  History  Administered Date(s) Administered  . Influenza Split 04/12/2015  . Influenza,inj,Quad PF,6+ Mos 04/26/2017, 04/20/2018  . Influenza-Unspecified 05/12/2017  . Tdap 05/03/2018   Health Maintenance Due  Topic Date Due  . PAP SMEAR  04/04/1977  . COLONOSCOPY  04/04/2006  . MAMMOGRAM  08/19/2017  . DEXA SCAN  08/19/2017    Patient Care Team: Sunnie Nielsen, DO as PCP - General (Osteopathic Medicine)  Indicate any recent Medical Services you may have received from other than [facility] providers in the past year (date may be approximate).     Assessment:   This is a routine wellness examination for [facility].Physical assessment deferred to PCP.  Hearing/Vision screen  Visual Acuity Screening   Right eye Left eye Both eyes  Without correction:     With correction: 20/50 20/40 20/50   Hearing Screening Comments: Whisper test done and patient reported back all 3 words given.  Dietary issues and exercise activities discussed: Current Exercise Habits: Home exercise routine, Type of exercise: walking, Time (Minutes): 20, Frequency (Times/Week): 2, Weekly Exercise (Minutes/Week): 40, Intensity: Mild, Exercise limited by: psychological condition(s) Diet Not very healthy eating. Breakfast: peanut butter on toast Lunch: Meat and 2 vegetables Dinner:   Sandwich, cookie or canned fruit  Drinks water daily but not 64 ounces. Eats yogurt as a snack during the day    Goals    . Exercise 3x per week (30 min per time)     Increase exercise to 3 times a week for at least 30 minutes a day.      Depression Screen PHQ 2/9 Scores 05/17/2018 08/02/2017  PHQ - 2 Score 0 0  PHQ- 9 Score - 4    Fall Risk Fall Risk  05/17/2018  Falls in the past year? 0  Follow up Falls prevention discussed    Is the patient's home free of loose throw rugs in walkways, pet beds, electrical cords, etc?   yes      Grab bars in the bathroom? yes      Handrails on the stairs?   no      Adequate lighting?    yes   Cognitive Function:     6CIT Screen 05/17/2018  What Year? 0 points  What month? 0 points  What time? 0 points  Count back from 20 0 points  Months in reverse 0 points  Repeat phrase 0 points  Total Score 0    Screening Tests Health Maintenance  Topic Date Due  . PAP SMEAR  04/04/1977  . COLONOSCOPY  04/04/2006  . MAMMOGRAM  08/19/2017  . DEXA SCAN  08/19/2017  . TETANUS/TDAP  05/03/2028  . INFLUENZA VACCINE  Completed  . Hepatitis C Screening  Completed  . HIV Screening  Completed      Plan:  Please schedule your next medicare wellness visit with me in 1 yr.  Ms. Mcleish , Thank you for taking time to come for your Medicare Wellness Visit. I appreciate your ongoing commitment to your health goals. Please review the following plan we discussed and let me know if I can assist you in the future.  Continue doing brain stimulating activities (puzzles, reading, adult coloring books, staying active) to keep memory sharp.    These are the goals we discussed: Goals    . Exercise 3x per week (30 min per time)     Increase exercise to 3 times a week for at least 30 minutes a day.       This is a list of the screening recommended for you and due dates:  Health Maintenance  Topic Date Due  . Pap Smear  04/04/1977  . Colon Cancer Screening  04/04/2006  . Mammogram  08/19/2017  . DEXA scan (bone density measurement)  08/19/2017  . Tetanus Vaccine  05/03/2028  . Flu Shot  Completed  .  Hepatitis C: One time screening is recommended by Center for Disease Control  (CDC) for  adults born from [birth year] through 1965.   Completed  . HIV Screening  Completed     I have personally reviewed and noted the following in the patient's chart:   . Medical and social history . Use  of alcohol, tobacco or illicit drugs  . Current medications and supplements . Functional ability and status . Nutritional status . Physical activity . Advanced directives . List of other  physicians . Hospitalizations, surgeries, and ER visits in previous 12 months . Vitals . Screenings to include cognitive, depression, and falls . Referrals and appointments  In addition, I have reviewed and discussed with patient certain preventive protocols, quality metrics, and best practice recommendations. A written personalized care plan for preventive services as well as general preventive health recommendations were provided to patient.     Normand Sloop, LPN   16/07/958

## 2018-05-03 NOTE — Progress Notes (Signed)
HPI: Yolanda Howell is a 62 y.o. female who  has a past medical history of B12 deficiency (01/25/2012), Bipolar disorder (HCC) (08/02/2017), History of DVT (deep vein thrombosis) (08/02/2017), History of GI bleed (12/30/2011), Iron deficiency anemia (11/06/2014), Major depressive disorder, recurrent episode (HCC) (09/06/2011), and Schizoaffective disorder (HCC) (08/31/2011).  she presents to [facility] today, 05/03/18,  for chief complaint of:  Follow-up fatigue  Recently saw a colleague here 13 days ago, chief complaint of fatigue.  No acute concerns on EKG.  No alarm signs or symptoms.  INR and CBC were okay, she is on warfarin for history of DVT, but she also has a history of GI bleed and anemia.  Sedating medications include clonazepam, fentanyl patches, hydroxyzine, melatonin, metoprolol, mirtazapine, risperidone, ropinirole...  Today, patient's feels fatigue is improved.  She describes fatigue as a full body heaviness type feeling, no chest pain or shortness of breath, no headache or dizziness.    Patient is accompanied by caseworker, Marylene Land, who assists with history-taking.   Past medical history, surgical history, and family history reviewed.  Current medication list and allergy/intolerance information reviewed.   (See remainder of HPI, ROS, Phys Exam below)    ASSESSMENT/PLAN:   Fatigue, unspecified type  Need for Tdap vaccination - Plan: Tdap vaccine greater than or equal to 7yo IM  High risk medication use - Plan: clonazePAM (KLONOPIN) 0.5 MG tablet  Psychiatric illness - Plan: clonazePAM (KLONOPIN) 0.5 MG tablet   Meds ordered this encounter  Medications  . clonazePAM (KLONOPIN) 0.5 MG tablet    Sig: Take 0.5 tablets (0.25 mg total) by mouth 3 (three) times daily as needed for anxiety.    Dispense:  45 tablet    Refill:  2    Patient Instructions  Fatigue: No alarming symptoms to concern [place] for anything really serious going on, but  if anything changes or gets worse, please let me know.  Try being more active, getting on a schedule of walking at least 20 minutes/day.  Work on cutting back smoking.  Would check with your psychiatrist about sedating effects of your medications and whether they might recommend alternative treatments.    Follow-up plan: Return in about 3 months (around 08/03/2018) for Annual wellness physical/preventive care checkup.  Or if needed.            ############################################ ############################################ ############################################ ############################################    Outpatient Encounter Medications as of 05/03/2018  Medication Sig  . clonazePAM (KLONOPIN) 0.5 MG tablet TAKE ONE-HALF TABLET BY MOUTH THREE TIMES DAILY AS NEEDED FOR anxiety  . fenofibrate (TRICOR) 145 MG tablet TAKE ONE TABLET BY MOUTH DAILY  . fentaNYL (DURAGESIC - DOSED MCG/HR) 50 MCG/HR PLACE ONE PATCH ONTO SKIN EVERY 72 HOURS FOR 30 DAYS  . ferrous sulfate 325 (65 FE) MG tablet TAKE 1 TABLET ONCE A DAY  . hydrOXYzine (ATARAX/VISTARIL) 50 MG tablet TAKE TWO TABLETS BY MOUTH at bed time AS NEEDED (help with SLEEP)  . Melatonin 3 MG TABS Take 3 mg by mouth at bedtime.  . metoprolol succinate (TOPROL-XL) 100 MG 24 hr tablet TAKE 1 TABLET ONCE A DAY  . mirtazapine (REMERON) 45 MG tablet Take 45 mg by mouth at bedtime.   . risperiDONE (RISPERDAL) 3 MG tablet Take 3 mg by mouth at bedtime.   Marland Kitchen rOPINIRole (REQUIP) 1 MG tablet Take 1 mg by mouth at bedtime.  . STOOL SOFTENER 100 MG capsule TAKE 1-2 CAPSULE BY MOUTH EVERY DAY  . vitamin B-12 (CYANOCOBALAMIN)  1000 MCG tablet TAKE ONE TABLET BY MOUTH DAILY  . warfarin (COUMADIN) 5 MG tablet TAKE ONE AND ONE-HALF TABLET ON tuesday, friday AND sunday. ONE TABLET ON ALL other DAYS   No facility-administered encounter medications on file as of 05/03/2018.    Allergies  Allergen Reactions  . Azithromycin Hives       Review of Systems:  Constitutional: No recent illness, +fatigue improved  HEENT: No  headache, no vision change  Cardiac: No  chest pain, No  pressure, No palpitations  Respiratory:  No  shortness of breath. No  Cough  Gastrointestinal: No  abdominal pain, no change on bowel habits  Musculoskeletal: No new myalgia/arthralgia  Skin: No  Rash  Neurologic: No  weakness, No  Dizziness  Psychiatric: No  concerns with depression, +concerns with anxiety  Exam:  BP 133/67 (BP Location: Left Arm, Patient Position: Sitting, Cuff Size: Normal)   Pulse (!) 57   Temp 98.3 F (36.8 C) (Oral)   Wt 237 lb (107.5 kg)   SpO2 94%   BMI 38.25 kg/m   Constitutional: VS see above. General Appearance: alert, well-developed, well-nourished, NAD  Eyes: Normal lids and conjunctive, non-icteric sclera  Ears, Nose, Mouth, Throat: MMM, Normal external inspection ears/nares/mouth/lips/gums.  Neck: No masses, trachea midline.   Respiratory: Normal respiratory effort. no wheeze, no rhonchi, no rales  Cardiovascular: S1/S2 normal, no murmur, no rub/gallop auscultated. RRR.   Musculoskeletal: Gait normal. Symmetric and independent movement of all extremities  Neurological: Normal balance/coordination. No tremor.  Skin: warm, dry, intact.   Psychiatric: Normal judgment/insight. Normal mood and affect. Oriented x3.   Visit summary with medication list and pertinent instructions was printed for patient to review, advised to alert [place] if any changes needed. All questions at time of visit were answered - patient instructed to contact office with any additional concerns. ER/RTC precautions were reviewed with the patient and understanding verbalized.   Follow-up plan: Return in about 3 months (around 08/03/2018) for Annual wellness physical/preventive care checkup.  Or if needed.    Please note: voice recognition software was used to produce this document, and typos may escape review. Please contact  Dr. Lyn Hollingshead for any needed clarifications.

## 2018-05-03 NOTE — Patient Instructions (Signed)
Fatigue: No alarming symptoms to concern [place] for anything really serious going on, but if anything changes or gets worse, please let me know.  Try being more active, getting on a schedule of walking at least 20 minutes/day.  Work on cutting back smoking.  Would check with your psychiatrist about sedating effects of your medications and whether they might recommend alternative treatments.

## 2018-05-05 ENCOUNTER — Encounter: Payer: Self-pay | Admitting: Osteopathic Medicine

## 2018-05-07 LAB — POCT INR: INR: 2.1 — AB (ref 0.9–1.1)

## 2018-05-09 ENCOUNTER — Telehealth: Payer: Self-pay | Admitting: Family Medicine

## 2018-05-09 NOTE — Telephone Encounter (Signed)
INR received from home monitoring.  INR was 2.1 within normal limits.  Continue current regimen.

## 2018-05-09 NOTE — Telephone Encounter (Signed)
Detailed msg left of confidential VM on guardian

## 2018-05-15 LAB — POCT INR: INR: 2 — AB (ref 0.9–1.1)

## 2018-05-16 LAB — POCT INR: INR: 2 — AB (ref 0.9–1.1)

## 2018-05-17 ENCOUNTER — Ambulatory Visit (INDEPENDENT_AMBULATORY_CARE_PROVIDER_SITE_OTHER): Payer: Medicare Other | Admitting: *Deleted

## 2018-05-17 VITALS — BP 120/64 | HR 68 | Ht 66.0 in | Wt 240.0 lb

## 2018-05-17 DIAGNOSIS — Z0000 Encounter for general adult medical examination without abnormal findings: Secondary | ICD-10-CM

## 2018-05-17 NOTE — Patient Instructions (Addendum)
Please schedule your next medicare wellness visit with me in 1 yr.  Yolanda Howell , Thank you for taking time to come for your Medicare Wellness Visit. I appreciate your ongoing commitment to your health goals. Please review the following plan we discussed and let me know if I can assist you in the future.  Continue doing brain stimulating activities (puzzles, reading, adult coloring books, staying active) to keep memory sharp.  These are the goals we discussed: Goals    . Exercise 3x per week (30 min per time)     Increase exercise to 3 times a week for at least 30 minutes a day.    WALKING  Walking is a great form of exercise to increase your strength, endurance and overall fitness.  A walking program can help you start slowly and gradually build endurance as you go.  Everyone's ability is different, so each person's starting point will be different.  You do not have to follow them exactly.  The are just samples. You should simply find out what's right for you and stick to that program.   In the beginning, you'll start off walking 2-3 times a day for short distances.  As you get stronger, you'll be walking further at just 1-2 times per day.  A. You Can Walk For A Certain Length Of Time Each Day    Walk 5 minutes 3 times per day.  Increase 2 minutes every 2 days (3 times per day).  Work up to 25-30 minutes (1-2 times per day).   Example:   Day 1-2 5 minutes 3 times per day   Day 7-8 12 minutes 2-3 times per day   Day 13-14 25 minutes 1-2 times per day  B. You Can Walk For a Certain Distance Each Day     Distance can be substituted for time.    Example:   3 trips to mailbox (at road)   3 trips to corner of block   3 trips around the block  C. Go to local high school and use the track.    Walk for distance 3 around track  Or time30 minutes  D. Walk 30     Please only do the exercises that your therapist has initialed and dated

## 2018-05-22 ENCOUNTER — Other Ambulatory Visit: Payer: Self-pay | Admitting: Osteopathic Medicine

## 2018-05-22 DIAGNOSIS — K5900 Constipation, unspecified: Secondary | ICD-10-CM

## 2018-05-22 DIAGNOSIS — E7800 Pure hypercholesterolemia, unspecified: Secondary | ICD-10-CM

## 2018-05-22 DIAGNOSIS — D509 Iron deficiency anemia, unspecified: Secondary | ICD-10-CM

## 2018-05-22 NOTE — Progress Notes (Signed)
Opened in error

## 2018-05-23 ENCOUNTER — Encounter: Payer: Self-pay | Admitting: Osteopathic Medicine

## 2018-05-23 LAB — PROTIME-INR

## 2018-05-30 LAB — POCT INR: INR: 2.2 — AB (ref 0.9–1.1)

## 2018-06-01 ENCOUNTER — Telehealth: Payer: Self-pay

## 2018-06-01 DIAGNOSIS — G8929 Other chronic pain: Secondary | ICD-10-CM

## 2018-06-01 DIAGNOSIS — Z79899 Other long term (current) drug therapy: Secondary | ICD-10-CM

## 2018-06-01 NOTE — Telephone Encounter (Signed)
Yolanda Howell called requesting a referral for pain mgmt. Current facility no longer seeing patients. She is requesting a referral for [name] for pain mgmt. Pt was given #30 days of fentanyl, however as per case worker, pt only has 2 wks of med available. Pls advise, thanks.

## 2018-06-01 NOTE — Telephone Encounter (Signed)
Sending referral to [facility]

## 2018-06-01 NOTE — Telephone Encounter (Signed)
See below

## 2018-06-06 ENCOUNTER — Encounter: Payer: Self-pay | Admitting: Osteopathic Medicine

## 2018-06-06 LAB — PROTIME-INR: INR: 2.2 — AB (ref 0.9–1.1)

## 2018-06-07 ENCOUNTER — Encounter: Payer: Self-pay | Admitting: Osteopathic Medicine

## 2018-06-07 LAB — PROTIME-INR: INR: 2.2 — AB (ref 0.9–1.1)

## 2018-06-18 ENCOUNTER — Other Ambulatory Visit: Payer: Self-pay | Admitting: Osteopathic Medicine

## 2018-06-18 DIAGNOSIS — E538 Deficiency of other specified B group vitamins: Secondary | ICD-10-CM

## 2018-06-20 NOTE — Telephone Encounter (Signed)
Changed to every other day

## 2018-06-20 NOTE — Telephone Encounter (Signed)
Left VM with status update for Pt's legal guardian.

## 2018-06-21 LAB — POCT INR: INR: 2.1 — AB (ref 0.9–1.1)

## 2018-07-07 ENCOUNTER — Telehealth: Payer: Self-pay | Admitting: Physician Assistant

## 2018-07-07 LAB — POCT INR: INR: 2.2 — AB (ref 0.9–1.1)

## 2018-07-07 NOTE — Telephone Encounter (Signed)
Left a detailed vm msg for Ms. Honor JunesAngela White (pt's caseworker). Direct call back info provided.

## 2018-07-07 NOTE — Telephone Encounter (Signed)
Received MD INR result INR dated 07/07/2018 is 2.2 Continue current dose

## 2018-07-16 ENCOUNTER — Other Ambulatory Visit: Payer: Self-pay | Admitting: Osteopathic Medicine

## 2018-07-16 DIAGNOSIS — G4700 Insomnia, unspecified: Secondary | ICD-10-CM

## 2018-07-16 DIAGNOSIS — K5900 Constipation, unspecified: Secondary | ICD-10-CM

## 2018-07-16 DIAGNOSIS — D509 Iron deficiency anemia, unspecified: Secondary | ICD-10-CM

## 2018-07-17 NOTE — Telephone Encounter (Signed)
[facility] pharmacy requesting med refill for ferrous sulphate, hydroxyzine, metoprolol succ, stool softner and ropinirole. Request for ropinirole written by historical provider. Thanks.

## 2018-07-17 NOTE — Telephone Encounter (Signed)
Left a detailed vm msg for Ms. Marylene Land (pt's case worker) regarding med refills sent to [facility]. Call back info provided.

## 2018-07-20 ENCOUNTER — Encounter: Payer: Self-pay | Admitting: Osteopathic Medicine

## 2018-07-22 LAB — POCT INR: INR: 2.2 — AB (ref 0.9–1.1)

## 2018-07-25 DIAGNOSIS — M129 Arthropathy, unspecified: Secondary | ICD-10-CM | POA: Diagnosis not present

## 2018-07-25 DIAGNOSIS — Z79899 Other long term (current) drug therapy: Secondary | ICD-10-CM | POA: Diagnosis not present

## 2018-07-25 DIAGNOSIS — M5136 Other intervertebral disc degeneration, lumbar region: Secondary | ICD-10-CM | POA: Diagnosis not present

## 2018-07-30 LAB — POCT INR: INR: 2.2 — AB (ref 0.9–1.1)

## 2018-08-06 DIAGNOSIS — Z7901 Long term (current) use of anticoagulants: Secondary | ICD-10-CM | POA: Diagnosis not present

## 2018-08-06 LAB — POCT INR: INR: 2.2 — AB (ref 0.9–1.1)

## 2018-08-10 ENCOUNTER — Encounter: Payer: Medicare Other | Admitting: Osteopathic Medicine

## 2018-08-14 ENCOUNTER — Other Ambulatory Visit: Payer: Self-pay | Admitting: Osteopathic Medicine

## 2018-08-14 DIAGNOSIS — F99 Mental disorder, not otherwise specified: Secondary | ICD-10-CM

## 2018-08-14 DIAGNOSIS — Z79899 Other long term (current) drug therapy: Secondary | ICD-10-CM

## 2018-08-14 LAB — POCT INR
INR: 2.3 — AB (ref 0.9–1.1)
INR: 2.3 — AB (ref 0.9–1.1)

## 2018-08-17 ENCOUNTER — Telehealth: Payer: Self-pay | Admitting: Osteopathic Medicine

## 2018-08-17 NOTE — Telephone Encounter (Signed)
Multiple same-day cancellations that I've counted....  NO SHOW - cancelled same day 07/31/2018 NO SHOW - cancelled same day 05/03/18 NO SHOW - cancelled same day 04/18/18 NO SHOW - cancelled a 10:30 appt at 10:08 same day 09/20/17 NO SHOW - to physical 03/30/18   If she same-day cancels or no-shows to another appointment, will result in discharge from the practice.   Please call patient to remind her of upcoming appointment and stress importance of 24 hour cancellations at least.

## 2018-08-18 ENCOUNTER — Ambulatory Visit: Payer: Medicare Other | Admitting: Osteopathic Medicine

## 2018-08-18 DIAGNOSIS — M5136 Other intervertebral disc degeneration, lumbar region: Secondary | ICD-10-CM | POA: Diagnosis not present

## 2018-08-18 DIAGNOSIS — Z79899 Other long term (current) drug therapy: Secondary | ICD-10-CM | POA: Diagnosis not present

## 2018-08-18 DIAGNOSIS — G8929 Other chronic pain: Secondary | ICD-10-CM | POA: Diagnosis not present

## 2018-08-18 DIAGNOSIS — M545 Low back pain: Secondary | ICD-10-CM | POA: Diagnosis not present

## 2018-08-18 NOTE — Telephone Encounter (Signed)
Left VM for Marylene Land, Legal Guardian, with upcoming appointment reminder.

## 2018-08-22 LAB — POCT INR: INR: 2.2 — AB (ref 0.9–1.1)

## 2018-08-25 ENCOUNTER — Encounter: Payer: Self-pay | Admitting: Osteopathic Medicine

## 2018-08-25 ENCOUNTER — Ambulatory Visit (INDEPENDENT_AMBULATORY_CARE_PROVIDER_SITE_OTHER): Payer: Medicare Other | Admitting: Osteopathic Medicine

## 2018-08-25 VITALS — BP 143/79 | HR 62 | Temp 98.2°F | Wt 235.0 lb

## 2018-08-25 DIAGNOSIS — Z0000 Encounter for general adult medical examination without abnormal findings: Secondary | ICD-10-CM | POA: Diagnosis not present

## 2018-08-25 DIAGNOSIS — Z1239 Encounter for other screening for malignant neoplasm of breast: Secondary | ICD-10-CM | POA: Diagnosis not present

## 2018-08-25 DIAGNOSIS — Z1211 Encounter for screening for malignant neoplasm of colon: Secondary | ICD-10-CM

## 2018-08-25 DIAGNOSIS — Z23 Encounter for immunization: Secondary | ICD-10-CM

## 2018-08-25 DIAGNOSIS — R768 Other specified abnormal immunological findings in serum: Secondary | ICD-10-CM

## 2018-08-25 DIAGNOSIS — Z122 Encounter for screening for malignant neoplasm of respiratory organs: Secondary | ICD-10-CM | POA: Diagnosis not present

## 2018-08-25 DIAGNOSIS — Z9189 Other specified personal risk factors, not elsewhere classified: Secondary | ICD-10-CM

## 2018-08-25 DIAGNOSIS — F17200 Nicotine dependence, unspecified, uncomplicated: Secondary | ICD-10-CM

## 2018-08-25 MED ORDER — ZOSTER VAC RECOMB ADJUVANTED 50 MCG/0.5ML IM SUSR: 0.5000 mL | Freq: Once | INTRAMUSCULAR | 1 refills | Status: AC

## 2018-08-25 NOTE — Patient Instructions (Addendum)
General Preventive Care  Most recent routine screening lipids/other labs: already done in 04/2018.   Everyone should have blood pressure checked once per year.  Tobacco: don't! Please let me know if you need help quitting!  Alcohol: you should NEVER drink alcohol because of the other medicines you are taking.    Exercise: as tolerated to reduce risk of cardiovascular disease and diabetes. Strength training will also prevent osteoporosis.   Mental health: if need for mental health care (medicines, counseling, other), or concerns about moods, please let me or your psychiatrist know!  Vaccines  Flu vaccine: recommended for almost everyone, every fall.   Shingles vaccine: Shingrix recommended after age 43. See printed prescription - Medicare patients need to get this at the pharmacy.   Pneumonia vaccines: Prevnar and Pneumovax recommended after age 70, or sooner for smokers. Please check if you have had this shot!   Tetanus booster: Tdap recommended every 10 years. Due 04/2028 Cancer screenings   Colon cancer screening: We need Colonoscopy records!   Breast cancer screening: mammogram ordered.   Cervical cancer screening: We need Pap records!  Lung cancer screening: Will get yo uset up with CT of the chest to test for lung cancer, givne your smoking history.  Infection screenings . HIV: recommended screening at least once age 32-65. Done and negative.  . Gonorrhea/Chlamydia: screening as needed. . Hepatitis C: you tested positive when Dr Denyse Amass got blood work, but confirmatory testing was negative. You DO NOT have Hepatitis C, but possible past infection, or false positive test.   . TB: certain at-risk populations, or depending on work requirements and/or travel history Other . Bone Density Test: recommended for women at age 84.

## 2018-08-25 NOTE — Progress Notes (Signed)
HPI: Yolanda Howell is a 63 y.o. female who  has a past medical history of B12 deficiency (01/25/2012), Bipolar disorder (HCC) (08/02/2017), History of DVT (deep vein thrombosis) (08/02/2017), History of GI bleed (12/30/2011), Iron deficiency anemia (11/06/2014), Major depressive disorder, recurrent episode (HCC) (09/06/2011), and Schizoaffective disorder (HCC) (08/31/2011).  she presents to [facility] [place] today, 08/25/18,  for chief complaint of: Annual check-up  Hep C (+) testing     Patient here for annual physical / wellness exam.  See preventive care reviewed as below.  Recent labs reviewed in detail with the patient & caseworker.   Additional concerns today include: Persistent fatigue, has not gotten worse or changed.  Is due to follow-up with psychiatry, would like to discuss coming down or off on some of her meds   Patient is accompanied by caseworker, Yolanda LandAngela, who assists with history-taking.   Past medical, surgical, social and family history reviewed:  Patient Active Problem List   Diagnosis Date Noted  . Positive hepatitis C antibody test 08/25/2018  . HLD (hyperlipidemia) 04/20/2018  . Bipolar disorder (HCC) 08/02/2017  . High risk medication use 08/02/2017  . History of DVT (deep vein thrombosis) 08/02/2017  . Other chronic pain 08/02/2017  . Psychiatric illness 08/02/2017  . Iron deficiency anemia 11/06/2014  . Chronic gastric ulcer without hemorrhage or perforation 09/12/2013  . Paraesophageal hernia 09/12/2013  . B12 deficiency 01/25/2012  . History of GI bleed 12/30/2011  . Major depressive disorder, recurrent episode (HCC) 09/06/2011  . Schizoaffective disorder (HCC) 08/31/2011    No past surgical history on file.  Social History   Tobacco Use  . Smoking status: Current Every Day Smoker    Packs/day: 0.25  . Smokeless tobacco: Never Used  Substance Use Topics  . Alcohol use: No    Frequency: Never    No family history  on file.   Current medication list and allergy/intolerance information reviewed:    Current Outpatient Medications  Medication Sig Dispense Refill  . clonazePAM (KLONOPIN) 0.5 MG tablet TAKE ONE-HALF TABLET BY MOUTH THREE TIMES DAILY AS NEEDED FOR anxiety 45 tablet 2  . fenofibrate (TRICOR) 145 MG tablet TAKE ONE TABLET BY MOUTH DAILY 90 tablet 1  . fentaNYL (DURAGESIC - DOSED MCG/HR) 50 MCG/HR PLACE ONE PATCH ONTO SKIN EVERY 72 HOURS FOR 30 DAYS  0  . ferrous sulfate 325 (65 FE) MG tablet TAKE 1 TABLET ONCE A DAY 30 tablet 1  . hydrOXYzine (ATARAX/VISTARIL) 50 MG tablet TAKE TWO TABLETS BY MOUTH at bed time AS NEEDED (help with SLEEP) 60 tablet 3  . Melatonin 3 MG TABS Take 3 mg by mouth at bedtime.    . metoprolol succinate (TOPROL-XL) 100 MG 24 hr tablet TAKE 1 TABLET ONCE A DAY 30 tablet 1  . mirtazapine (REMERON) 45 MG tablet Take 45 mg by mouth at bedtime.     . risperiDONE (RISPERDAL) 3 MG tablet Take 3 mg by mouth at bedtime.     Marland Kitchen. rOPINIRole (REQUIP) 1 MG tablet TAKE ONE TABLET BY MOUTH TWICE DAILY 60 tablet 3  . STOOL SOFTENER 100 MG capsule TAKE 1-2 CAPSULE BY MOUTH EVERY DAY 60 capsule 1  . vitamin B-12 (CYANOCOBALAMIN) 1000 MCG tablet TAKE ONE TABLET BY MOUTH DAILY 45 tablet 3  . warfarin (COUMADIN) 5 MG tablet TAKE ONE AND ONE-HALF TABLET ON tuesday, friday AND sunday. ONE TABLET ON ALL other DAYS 90 tablet 1  . Zoster Vaccine Adjuvanted [facility](SHINGRIX) injection Inject 0.5 mLs  into the muscle once for 1 dose. Repeat in 2 to 6 months 0.5 mL 1   No current facility-administered medications for this visit.     Allergies  Allergen Reactions  . Azithromycin Hives  . Pollen Extract Hives      Review of Systems:  Constitutional:  No  fever, no chills, No recent illness, No unintentional weight changes. +significant fatigue.   HEENT: No  headache, no vision change  Cardiac: No  chest pain, No  pressure,  Respiratory:  No  shortness of breath. No  Cough  Gastrointestinal: No   abdominal pain, No  nausea  Musculoskeletal: No new myalgia/arthralgia  Skin: No  Rash  Hem/Onc: No  easy bruising/bleeding  Endocrine: No cold intolerance,  No heat intolerance. No polyuria/polydipsia/polyphagia   Neurologic: No  weakness, No  dizziness  Psychiatric: No  concerns with depression, No  concerns with anxiety  Exam:  BP (!) 143/79 (BP Location: Left Arm, Patient Position: Sitting, Cuff Size: Normal)   Pulse 62   Temp 98.2 F (36.8 C) (Oral)   Wt 235 lb (106.6 kg)   BMI 37.93 kg/m   Constitutional: VS see above. General Appearance: alert, well-developed, well-nourished, NAD  Eyes: Normal lids and conjunctive, non-icteric sclera  Ears, Nose, Mouth, Throat: MMM, Normal external inspection ears/nares/mouth/lips/gums.   Neck: No masses, trachea midline. No thyroid enlargement. No tenderness/mass appreciated. No lymphadenopathy  Respiratory: Normal respiratory effort. no wheeze, no rhonchi, no rales  Cardiovascular: S1/S2 normal, no murmur, no rub/gallop auscultated. RRR. No lower extremity edema.   Gastrointestinal: Nontender, no masses.   Musculoskeletal: Gait normal. No clubbing/cyanosis of digits.   Neurological: Normal balance/coordination. No tremor. No cranial nerve deficit on limited exam. Motor and sensation intact and symmetric. Cerebellar reflexes intact.   Skin: warm, dry, intact. No rash/ulcer.   Psychiatric: Normal judgment/insight. Normal mood and affect. Oriented x3.      ASSESSMENT/PLAN: The primary encounter diagnosis was Annual physical exam. Diagnoses of Positive hepatitis C antibody test, Encounter for screening for malignant neoplasm of respiratory organs, Breast cancer screening, Colon cancer screening, At high risk for osteoporosis, Smoker, Need for shingles vaccine, and Need for 23-polyvalent pneumococcal polysaccharide vaccine were also pertinent to this visit.  Caseworker will try to track down records from colonoscopy, Pap,  pneumonia vaccine.   Orders Placed This Encounter  Procedures  . MM 3D SCREEN BREAST BILATERAL  . DG Bone Density  . CT CHEST LUNG CA SCREEN LOW DOSE W/O CM    Meds ordered this encounter  Medications  . Zoster Vaccine Adjuvanted [facility]) injection    Sig: Inject 0.5 mLs into the muscle once for 1 dose. Repeat in 2 to 6 months    Dispense:  0.5 mL    Refill:  1    Patient Instructions  General Preventive Care  Most recent routine screening lipids/other labs: already done in 04/2018.   Everyone should have blood pressure checked once per year.  Tobacco: don't! Please let me know if you need help quitting!  Alcohol: you should NEVER drink alcohol because of the other medicines you are taking.    Exercise: as tolerated to reduce risk of cardiovascular disease and diabetes. Strength training will also prevent osteoporosis.   Mental health: if need for mental health care (medicines, counseling, other), or concerns about moods, please let me or your psychiatrist know!  Vaccines  Flu vaccine: recommended for almost everyone, every fall.   Shingles vaccine: Shingrix recommended after age 58. See printed prescription -  Medicare patients need to get this at the pharmacy.   Pneumonia vaccines: Prevnar and Pneumovax recommended after age 69, or sooner for smokers. Please check if you have had this shot!   Tetanus booster: Tdap recommended every 10 years. Due 04/2028 Cancer screenings   Colon cancer screening: We need Colonoscopy records!   Breast cancer screening: mammogram ordered.   Cervical cancer screening: We need Pap records!  Lung cancer screening: Will get yo uset up with CT of the chest to test for lung cancer, givne your smoking history.  Infection screenings . HIV: recommended screening at least once age 82-65. Done and negative.  . Gonorrhea/Chlamydia: screening as needed. . Hepatitis C: you tested positive when Dr Denyse Amass got blood work, but confirmatory testing  was negative. You DO NOT have Hepatitis C, but possible past infection, or false positive test.   . TB: certain at-risk populations, or depending on work requirements and/or travel history Other . Bone Density Test: recommended for women at age 19.            Visit summary with medication list and pertinent instructions was printed for patient to review. All questions at time of visit were answered - patient instructed to contact office with any additional concerns or updates. ER/RTC precautions were reviewed with the patient.     Please note: voice recognition software was used to produce this document, and typos may escape review. Please contact Dr. Lyn Hollingshead for any needed clarifications.     Follow-up plan: Return in about 6 months (around 02/23/2019) for recheck chronic problems, see me sooner if needed. Marland Kitchen

## 2018-09-04 DIAGNOSIS — Z7901 Long term (current) use of anticoagulants: Secondary | ICD-10-CM | POA: Diagnosis not present

## 2018-09-04 LAB — POCT INR
INR: 2.1 — AB (ref 0.9–1.1)
INR: 2.2 — AB (ref 0.9–1.1)

## 2018-09-06 ENCOUNTER — Ambulatory Visit (INDEPENDENT_AMBULATORY_CARE_PROVIDER_SITE_OTHER): Payer: Medicare Other

## 2018-09-06 ENCOUNTER — Ambulatory Visit: Payer: Medicare Other

## 2018-09-06 DIAGNOSIS — Z1231 Encounter for screening mammogram for malignant neoplasm of breast: Secondary | ICD-10-CM | POA: Diagnosis not present

## 2018-09-06 DIAGNOSIS — M85852 Other specified disorders of bone density and structure, left thigh: Secondary | ICD-10-CM | POA: Diagnosis not present

## 2018-09-06 DIAGNOSIS — Z780 Asymptomatic menopausal state: Secondary | ICD-10-CM | POA: Diagnosis not present

## 2018-09-07 MED ORDER — CALCIUM CARBONATE-VITAMIN D 600-400 MG-UNIT PO CHEW: 2.0000 | CHEWABLE_TABLET | Freq: Every day | ORAL | 3 refills | Status: DC

## 2018-09-07 NOTE — Addendum Note (Signed)
Addended by: Deirdre Pippins on: 09/07/2018 09:22 AM   Modules accepted: Orders

## 2018-09-12 ENCOUNTER — Telehealth: Payer: Self-pay

## 2018-09-12 DIAGNOSIS — Z79899 Other long term (current) drug therapy: Secondary | ICD-10-CM

## 2018-09-12 DIAGNOSIS — F99 Mental disorder, not otherwise specified: Secondary | ICD-10-CM

## 2018-09-12 NOTE — Telephone Encounter (Signed)
Ms. Yolanda Howell called requesting med refill for clonazepam. Pls send rx to [facility] pharmacy.

## 2018-09-13 ENCOUNTER — Other Ambulatory Visit: Payer: Self-pay

## 2018-09-13 DIAGNOSIS — Z86718 Personal history of other venous thrombosis and embolism: Secondary | ICD-10-CM

## 2018-09-13 DIAGNOSIS — K5900 Constipation, unspecified: Secondary | ICD-10-CM

## 2018-09-13 DIAGNOSIS — D509 Iron deficiency anemia, unspecified: Secondary | ICD-10-CM

## 2018-09-13 DIAGNOSIS — E7800 Pure hypercholesterolemia, unspecified: Secondary | ICD-10-CM

## 2018-09-13 MED ORDER — FENOFIBRATE 145 MG PO TABS: 145.0000 mg | ORAL_TABLET | Freq: Every day | ORAL | 1 refills | Status: AC

## 2018-09-13 MED ORDER — CLONAZEPAM 0.5 MG PO TABS: ORAL_TABLET | ORAL | 2 refills | Status: AC

## 2018-09-13 MED ORDER — METOPROLOL SUCCINATE ER 100 MG PO TB24: 100.0000 mg | ORAL_TABLET | Freq: Every day | ORAL | 1 refills | Status: AC

## 2018-09-13 MED ORDER — FERROUS SULFATE 325 (65 FE) MG PO TABS: 325.0000 mg | ORAL_TABLET | Freq: Every day | ORAL | 1 refills | Status: AC

## 2018-09-13 MED ORDER — DOCUSATE SODIUM 100 MG PO CAPS: ORAL_CAPSULE | ORAL | 1 refills | Status: DC

## 2018-09-13 MED ORDER — WARFARIN SODIUM 5 MG PO TABS: ORAL_TABLET | ORAL | 1 refills | Status: AC

## 2018-09-13 NOTE — Telephone Encounter (Signed)
done

## 2018-09-13 NOTE — Telephone Encounter (Signed)
Ms. Yolanda Howell (case worker) has been updated. She also requested med refills for fenofibrate, metoprolol succ., ferrous sul., stool softener and warfarin. Task completed, sent to [facility] pharmacy. No other inquiries during call.

## 2018-09-14 ENCOUNTER — Other Ambulatory Visit: Payer: Self-pay | Admitting: Osteopathic Medicine

## 2018-09-14 DIAGNOSIS — D509 Iron deficiency anemia, unspecified: Secondary | ICD-10-CM

## 2018-09-14 NOTE — Telephone Encounter (Signed)
[facility] does not feel for this provider.

## 2018-09-15 ENCOUNTER — Telehealth: Payer: Self-pay | Admitting: Osteopathic Medicine

## 2018-09-15 DIAGNOSIS — M545 Low back pain: Secondary | ICD-10-CM | POA: Diagnosis not present

## 2018-09-15 DIAGNOSIS — G8929 Other chronic pain: Secondary | ICD-10-CM | POA: Diagnosis not present

## 2018-09-15 DIAGNOSIS — M5136 Other intervertebral disc degeneration, lumbar region: Secondary | ICD-10-CM | POA: Diagnosis not present

## 2018-09-15 DIAGNOSIS — Z79899 Other long term (current) drug therapy: Secondary | ICD-10-CM | POA: Diagnosis not present

## 2018-09-15 MED ORDER — CALCIUM-VITAMIN D-MINERALS 600-800 MG-UNIT PO CHEW: 2.0000 | CHEWABLE_TABLET | Freq: Every day | ORAL | 3 refills | Status: AC

## 2018-09-15 NOTE — Telephone Encounter (Signed)
Pharmacy requested prescription adjustment for calcium/vitamin D

## 2018-09-18 ENCOUNTER — Encounter: Payer: Self-pay | Admitting: Osteopathic Medicine

## 2018-09-18 LAB — PT WITH INR/FINGERSTICK: INR: 2.1

## 2018-09-18 LAB — POCT INR: INR: 2.2 — AB (ref 0.9–1.1)

## 2018-09-19 ENCOUNTER — Encounter: Payer: Self-pay | Admitting: Osteopathic Medicine

## 2018-10-03 DIAGNOSIS — Z7901 Long term (current) use of anticoagulants: Secondary | ICD-10-CM | POA: Diagnosis not present

## 2018-10-03 LAB — PT WITH INR/FINGERSTICK: INR: 2.1

## 2018-10-03 LAB — POCT INR: INR: 2.1 — AB (ref 0.9–1.1)

## 2018-10-04 ENCOUNTER — Encounter: Payer: Self-pay | Admitting: Osteopathic Medicine

## 2018-10-06 DIAGNOSIS — G8929 Other chronic pain: Secondary | ICD-10-CM | POA: Diagnosis not present

## 2018-10-06 DIAGNOSIS — M5136 Other intervertebral disc degeneration, lumbar region: Secondary | ICD-10-CM | POA: Diagnosis not present

## 2018-10-06 DIAGNOSIS — M545 Low back pain: Secondary | ICD-10-CM | POA: Diagnosis not present

## 2018-10-06 DIAGNOSIS — Z79899 Other long term (current) drug therapy: Secondary | ICD-10-CM | POA: Diagnosis not present

## 2018-10-10 LAB — POCT INR: INR: 2.1 — AB (ref 0.9–1.1)

## 2018-10-12 LAB — POCT INR: INR: 2.2 — AB (ref 0.9–1.1)

## 2018-10-17 LAB — POCT INR: INR: 2.1 — AB (ref 0.9–1.1)

## 2018-10-23 LAB — POCT INR: INR: 2.2 — AB (ref 0.9–1.1)

## 2018-10-27 ENCOUNTER — Encounter: Payer: Self-pay | Admitting: Osteopathic Medicine

## 2018-10-30 DIAGNOSIS — Z7901 Long term (current) use of anticoagulants: Secondary | ICD-10-CM | POA: Diagnosis not present

## 2018-10-30 LAB — POCT INR: INR: 2.2 — AB (ref <=1.1)

## 2018-10-31 ENCOUNTER — Other Ambulatory Visit: Payer: Self-pay | Admitting: Osteopathic Medicine

## 2018-10-31 DIAGNOSIS — G4700 Insomnia, unspecified: Secondary | ICD-10-CM

## 2018-10-31 NOTE — Telephone Encounter (Signed)
  Requested Prescriptions  Pending Prescriptions Disp Refills   rOPINIRole (REQUIP) 1 MG tablet [Pharmacy Med Name: ropinirole 1 mg tablet] 60 tablet 3    Sig: TAKE ONE TABLET BY MOUTH TWICE DAILY     Neurology:  Parkinsonian Agents Failed - 10/31/2018  8:00 AM      Failed - Last BP in normal range    BP Readings from Last 1 Encounters:  08/25/18 (!) 143/79         Passed - Valid encounter within last 12 months    Recent Outpatient Visits          2 months ago Annual physical exam   [place] [facility], Dorene Grebe, DO   6 months ago Fatigue, unspecified type   [facility], Dorene Grebe, DO   6 months ago Fatigue, unspecified type   [place] [facility], Michel Harrow, MD   1 year ago Anticoagulation goal of INR 2 to 3   [facility], Dorene Grebe, DO   1 year ago High risk medication use   [place] [facility], Dorene Grebe, DO      Future Appointments            In 6 months [place] [facility]           hydrOXYzine (ATARAX/VISTARIL) 50 MG tablet [Pharmacy Med Name: hydroxyzine HCl 50 mg tablet] 60 tablet 3    Sig: TAKE TWO TABLETS BY MOUTH AT BEDTIME AS NEEDED TO help with SLEEP     Ear, Nose, and Throat:  Antihistamines Passed - 10/31/2018  8:00 AM      Passed - Valid encounter within last 12 months    Recent Outpatient Visits          2 months ago Annual physical exam   [place] [facility], Dorene Grebe, DO   6 months ago Fatigue, unspecified type   [facility], Dorene Grebe, DO   6 months ago Fatigue, unspecified type   [place] [facility], Michel Harrow, MD   1 year ago Anticoagulation goal of INR 2 to 3   [facility],  Dorene Grebe, DO   1 year ago High risk medication use   [place] [facility], Dorene Grebe, DO      Future Appointments            In 6 months  [facility]

## 2018-11-03 DIAGNOSIS — M419 Scoliosis, unspecified: Secondary | ICD-10-CM | POA: Diagnosis not present

## 2018-11-03 DIAGNOSIS — M5416 Radiculopathy, lumbar region: Secondary | ICD-10-CM | POA: Diagnosis not present

## 2018-11-03 DIAGNOSIS — Z79899 Other long term (current) drug therapy: Secondary | ICD-10-CM | POA: Diagnosis not present

## 2018-11-03 DIAGNOSIS — M5136 Other intervertebral disc degeneration, lumbar region: Secondary | ICD-10-CM | POA: Diagnosis not present

## 2018-11-03 DIAGNOSIS — M48061 Spinal stenosis, lumbar region without neurogenic claudication: Secondary | ICD-10-CM | POA: Diagnosis not present

## 2018-11-06 LAB — POCT INR: INR: 2.1 — AB (ref <=1.1)

## 2018-11-14 ENCOUNTER — Encounter: Payer: Self-pay | Admitting: Osteopathic Medicine

## 2018-11-14 LAB — POCT INR: INR: 2.1 — AB (ref <=1.1)

## 2018-11-20 LAB — PROTIME-INR

## 2018-11-27 ENCOUNTER — Other Ambulatory Visit: Payer: Self-pay | Admitting: Osteopathic Medicine

## 2018-11-27 DIAGNOSIS — Z7901 Long term (current) use of anticoagulants: Secondary | ICD-10-CM | POA: Diagnosis not present

## 2018-11-27 DIAGNOSIS — K5900 Constipation, unspecified: Secondary | ICD-10-CM

## 2018-11-27 NOTE — Telephone Encounter (Signed)
Please review for refill- patient at [facility] 

## 2018-11-30 ENCOUNTER — Other Ambulatory Visit: Payer: Self-pay | Admitting: Osteopathic Medicine

## 2018-11-30 DIAGNOSIS — M419 Scoliosis, unspecified: Secondary | ICD-10-CM | POA: Diagnosis not present

## 2018-11-30 DIAGNOSIS — Z79899 Other long term (current) drug therapy: Secondary | ICD-10-CM | POA: Diagnosis not present

## 2018-11-30 DIAGNOSIS — M5136 Other intervertebral disc degeneration, lumbar region: Secondary | ICD-10-CM | POA: Diagnosis not present

## 2018-11-30 DIAGNOSIS — M5416 Radiculopathy, lumbar region: Secondary | ICD-10-CM | POA: Diagnosis not present

## 2018-11-30 DIAGNOSIS — M48061 Spinal stenosis, lumbar region without neurogenic claudication: Secondary | ICD-10-CM | POA: Diagnosis not present

## 2018-12-12 ENCOUNTER — Encounter: Payer: Self-pay | Admitting: Osteopathic Medicine

## 2018-12-24 ENCOUNTER — Other Ambulatory Visit: Payer: Self-pay | Admitting: Osteopathic Medicine

## 2018-12-24 DIAGNOSIS — E538 Deficiency of other specified B group vitamins: Secondary | ICD-10-CM

## 2018-12-25 DIAGNOSIS — Z7901 Long term (current) use of anticoagulants: Secondary | ICD-10-CM | POA: Diagnosis not present

## 2018-12-27 ENCOUNTER — Other Ambulatory Visit: Payer: Self-pay

## 2018-12-27 ENCOUNTER — Other Ambulatory Visit: Payer: Self-pay | Admitting: Osteopathic Medicine

## 2018-12-27 MED ORDER — CLONIDINE HCL 0.1 MG PO TABS: 0.1000 mg | ORAL_TABLET | Freq: Two times a day (BID) | ORAL | 0 refills | Status: AC | PRN

## 2018-12-27 MED ORDER — ONDANSETRON 4 MG PO TBDP: 4.0000 mg | ORAL_TABLET | Freq: Four times a day (QID) | ORAL | 0 refills | Status: AC | PRN

## 2018-12-27 NOTE — Telephone Encounter (Signed)
Please advise 

## 2018-12-28 ENCOUNTER — Telehealth: Payer: Self-pay

## 2018-12-29 ENCOUNTER — Telehealth: Payer: Self-pay

## 2018-12-29 NOTE — Telephone Encounter (Signed)
Attempted to contact pt's brother. No answer, left a detailed vm msg to return call back to office. Direct call back info provided.

## 2018-12-29 NOTE — Telephone Encounter (Signed)
Discussed w/ patient's brother. Tried to answer questions as best I could. Questions about cause of death, advised him I do not have the death certificate yet and given the information relayed to me by EMS, Ms Sowder was found dead in her home without any obvious cause of death or evidence of drug paraphernalia or foul play or obvious accident. He did inform me there is some concern he has for overdose or foul play, apparently pt was seen having a couple over to her house, concern about drug dealing, she does have a history of opiate addition. I advised him to discuss w/ police on this matter re: any criminal investigation. He'd like an autopsy if possible. I would certainly say that I cannot easily determine cause of death, patient had risk factors but no terminal illness or other serious life-threatening health problem. Call got disconnected, I tried calling back twice, left voicemail, will reach out on Monday once I have the other papers.

## 2018-12-29 NOTE — Telephone Encounter (Signed)
Elmyra Ricks from  [employer] called - she will be stopping by the office on 11/04/22 to bring in the pt's death certificate. Address/phone information provided. No other inquiries during call.

## 2018-12-29 NOTE — Telephone Encounter (Signed)
Can we see what a good time to call is? I can contact him once I'm done seeing patients this morning, sometime between 12 and 1. Can let him know that I haven't received the death certificate yet.

## 2019-01-01 ENCOUNTER — Telehealth: Payer: Self-pay | Admitting: Osteopathic Medicine

## 2019-01-08 ENCOUNTER — Telehealth: Payer: Self-pay

## 2019-01-08 NOTE — Telephone Encounter (Signed)
Yolanda Howell from [employer] called - current death certificate cannot be accepted. Medical records unable to accept the form due to provider writing "died of natural causes" and "death unknown". Must be one or the other. She will be stopping by the office with a new death certificate form to be filled. She may be contacted at [phone] for add'l inquiries.

## 2019-01-09 NOTE — Telephone Encounter (Signed)
error 

## 2019-01-10 NOTE — Telephone Encounter (Signed)
Rec'd a call from [place] [place] requesting for provider to sign off on pt's death certificate. Return a call to [place] office, spoke with colleague Shanon Brow. He will be faxing over death certificate for patient. Wanted provider to be aware of the paperwork. Direct fax & office number provided. No other inquiries.

## 2019-01-10 NOTE — Telephone Encounter (Signed)
Already aware. Thanks 

## 2019-01-10 NOTE — Telephone Encounter (Signed)
Shareeka Yim (pt's brother) is requesting a call back from provider regarding pt's death. Wants to know how provider is going to proceed, has questions about signing of death certificate. He can be contacted at [phone].

## 2019-01-10 DEATH — deceased

## 2019-02-23 ENCOUNTER — Ambulatory Visit: Payer: Medicare Other | Admitting: Osteopathic Medicine

## 2019-03-16 NOTE — Progress Notes (Signed)
error 

## 2019-04-18 IMAGING — MG DIGITAL SCREENING BILATERAL MAMMOGRAM WITH IMPLANTS, CAD AND TOM
6 of 9 series · 6 of 25 positions shown · non-contrast
Comparison: None.

CLINICAL DATA: Screening.

EXAM:
DIGITAL SCREENING BILATERAL MAMMOGRAM WITH IMPLANT, CAD AND TOMO
The patient has a left retroglandular implant. Standard and implant
displaced views were performed.

[L MLO]
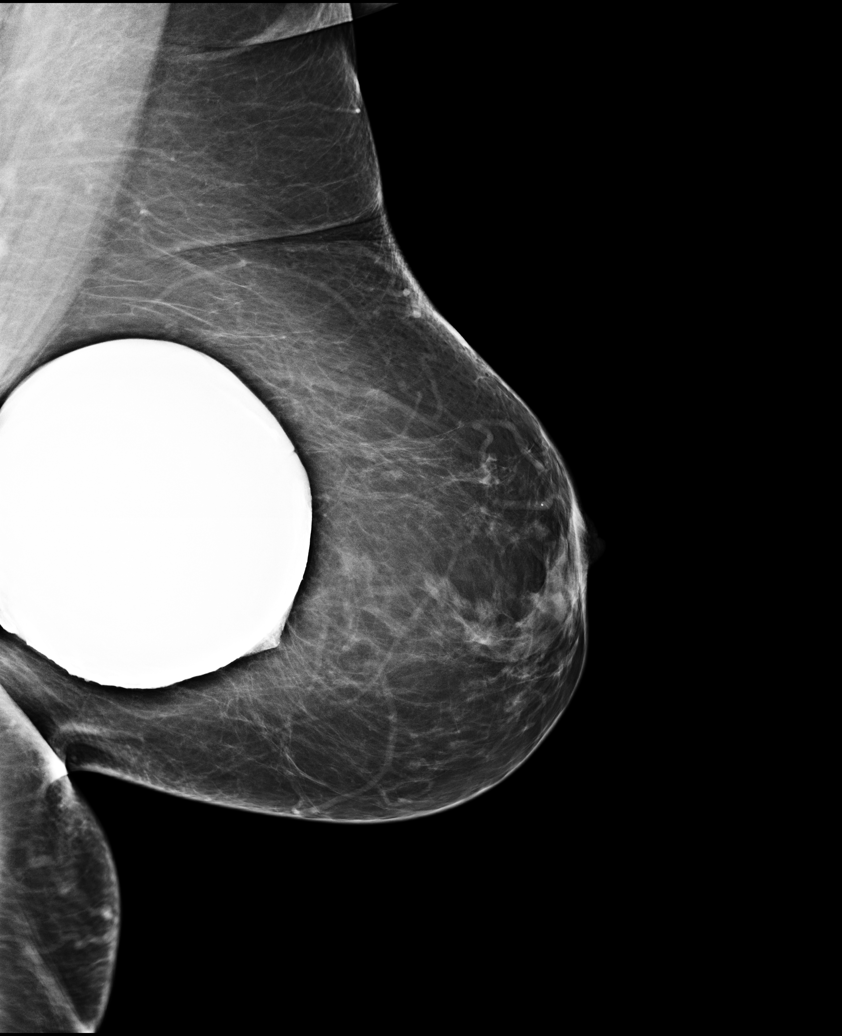

[L CC]
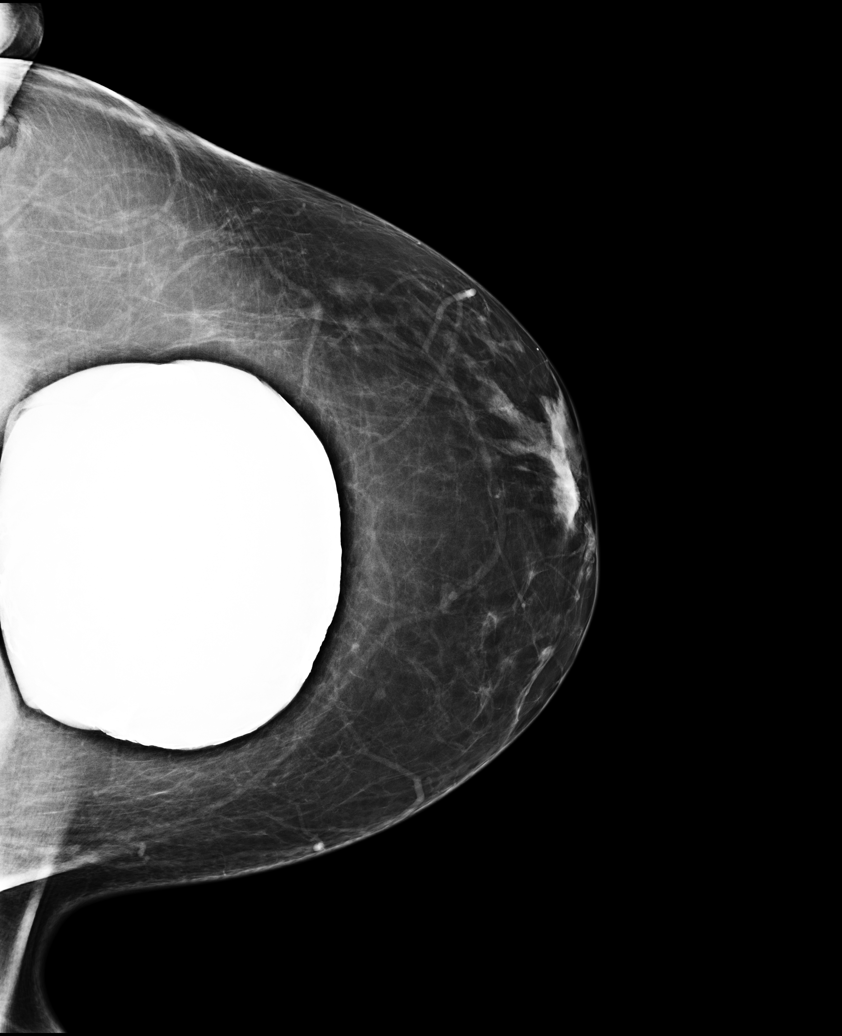

[R XCCL synth-2D]
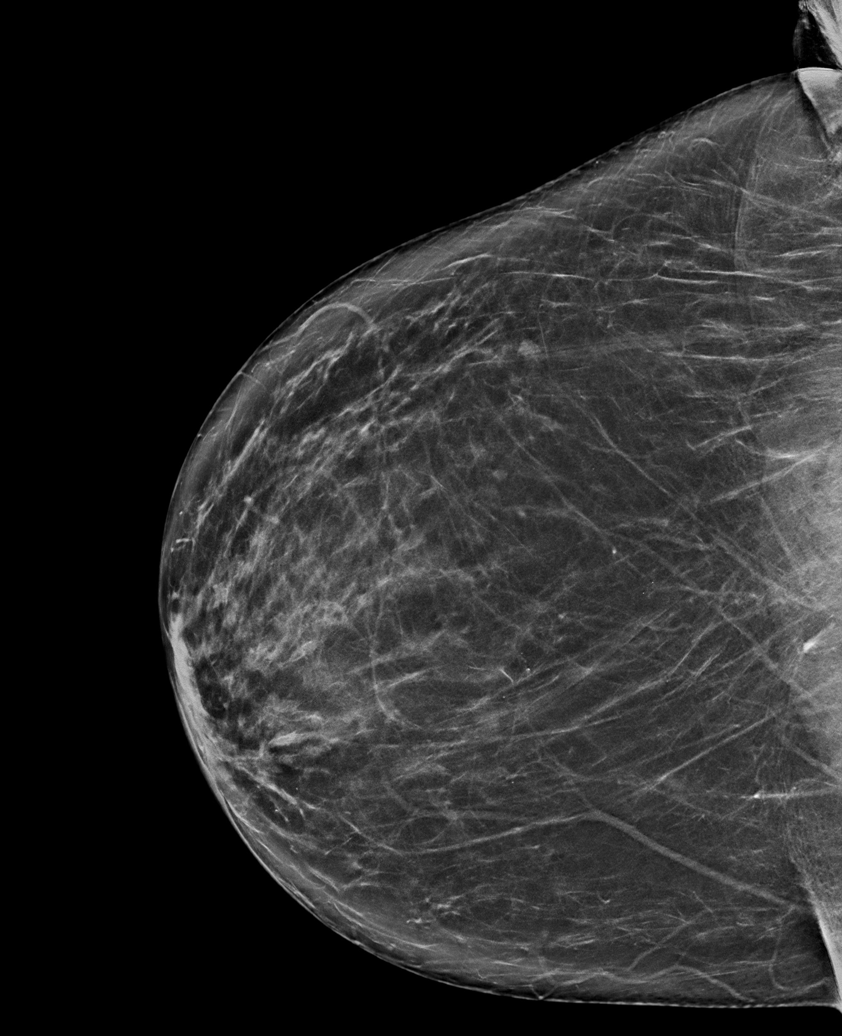

[R CC synth-2D]
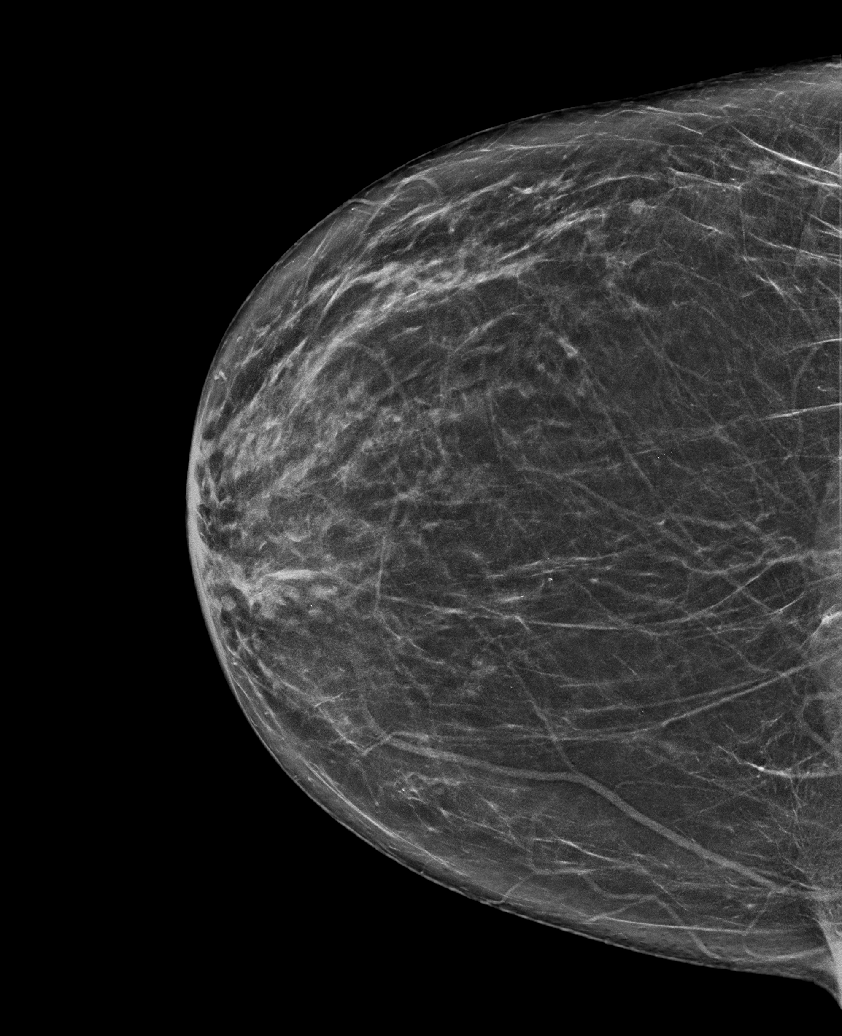

[R MLO synth-2D]
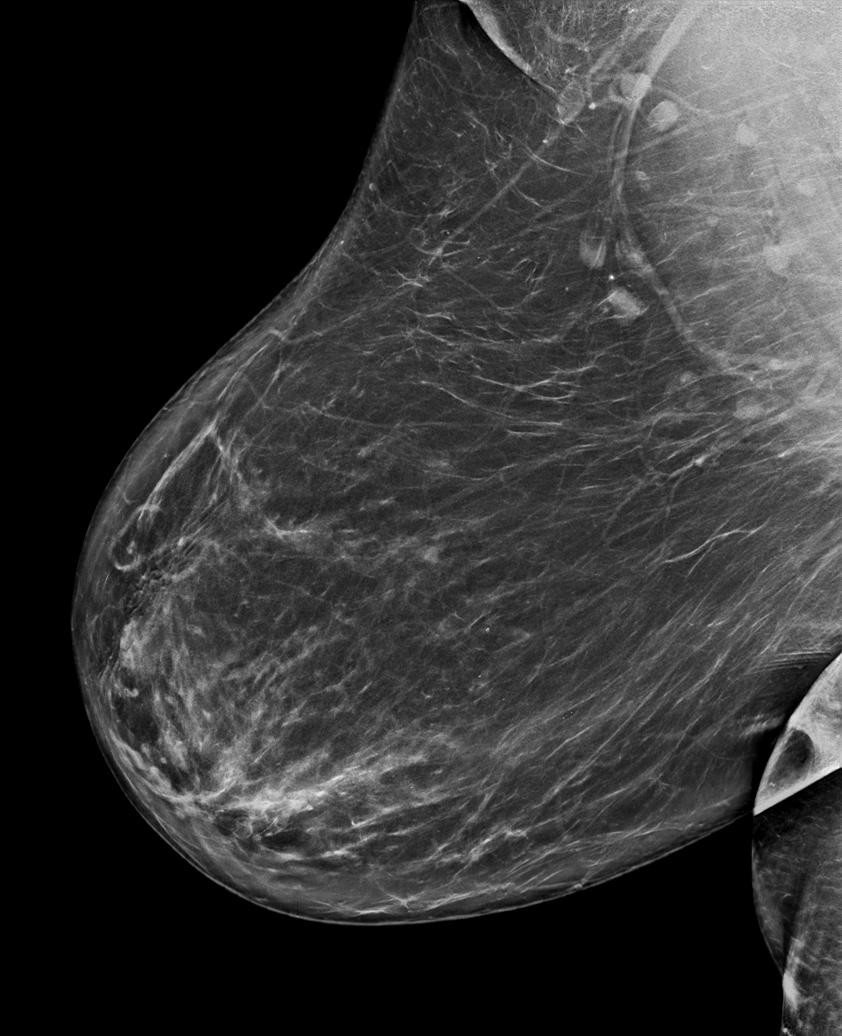

[L CC tomo · tomo slice 31/60.0]
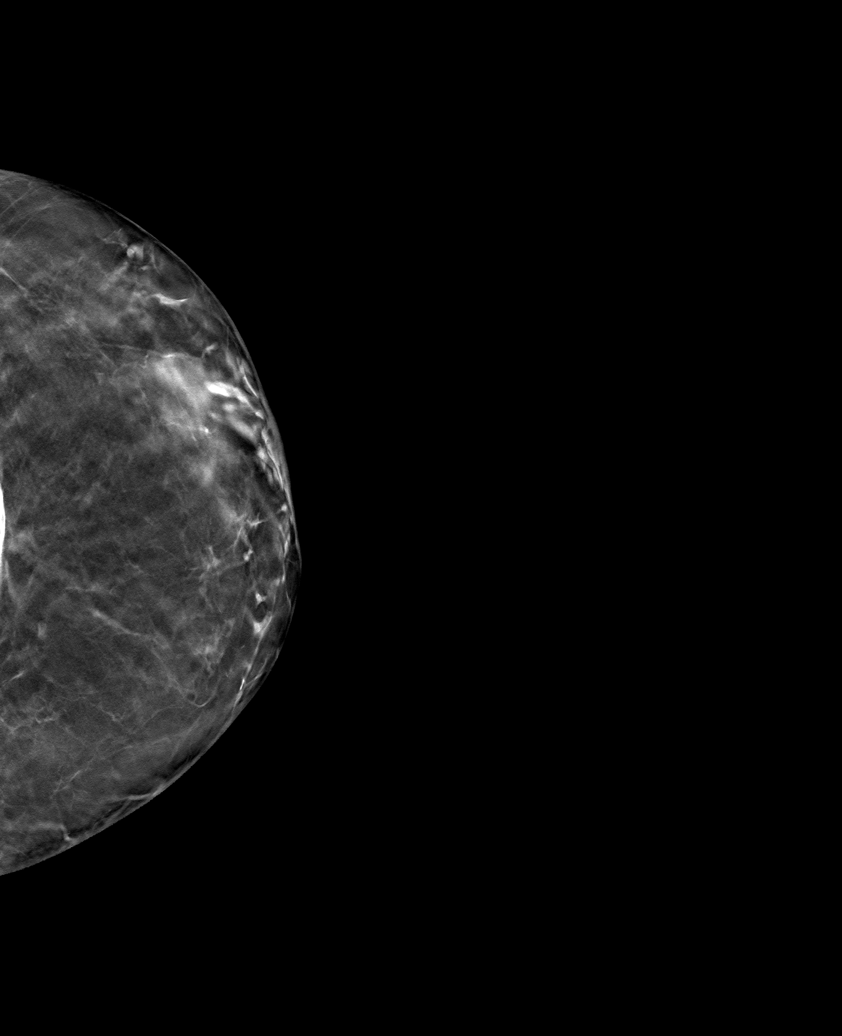

[6 of 25 positions shown; findings below may reference images not displayed]

ACR Breast Density Category b: There are scattered areas of
fibroglandular density.
FINDINGS: There are no findings suspicious for malignancy. Images were
processed with CAD.
IMPRESSION: No mammographic evidence of malignancy. A result letter of this
screening mammogram will be mailed directly to the patient.

RECOMMENDATION:
Screening mammogram in one year. (Code:[id number]-[id number])

BI-RADS CATEGORY  1:  Negative.

## 2019-05-23 ENCOUNTER — Ambulatory Visit: Payer: Medicare Other
